# Patient Record
Sex: Male | Born: 1955
Health system: Southern US, Community
[De-identification: ages and names within clinical notes are randomized; demographics above are authoritative.]

## PROBLEM LIST (undated history)

## (undated) DIAGNOSIS — M1990 Unspecified osteoarthritis, unspecified site: Secondary | ICD-10-CM

## (undated) DIAGNOSIS — I10 Essential (primary) hypertension: Secondary | ICD-10-CM

## (undated) DIAGNOSIS — G4700 Insomnia, unspecified: Secondary | ICD-10-CM

## (undated) DIAGNOSIS — F411 Generalized anxiety disorder: Secondary | ICD-10-CM

## (undated) DIAGNOSIS — IMO0002 Reserved for concepts with insufficient information to code with codable children: Secondary | ICD-10-CM

## (undated) DIAGNOSIS — I700 Atherosclerosis of aorta: Secondary | ICD-10-CM

## (undated) DIAGNOSIS — R918 Other nonspecific abnormal finding of lung field: Secondary | ICD-10-CM

## (undated) DIAGNOSIS — F419 Anxiety disorder, unspecified: Secondary | ICD-10-CM

## (undated) DIAGNOSIS — T4145XA Adverse effect of unspecified anesthetic, initial encounter: Secondary | ICD-10-CM

## (undated) DIAGNOSIS — F17200 Nicotine dependence, unspecified, uncomplicated: Secondary | ICD-10-CM

## (undated) DIAGNOSIS — N529 Male erectile dysfunction, unspecified: Secondary | ICD-10-CM

## (undated) DIAGNOSIS — K219 Gastro-esophageal reflux disease without esophagitis: Secondary | ICD-10-CM

## (undated) DIAGNOSIS — K648 Other hemorrhoids: Secondary | ICD-10-CM

## (undated) DIAGNOSIS — E785 Hyperlipidemia, unspecified: Secondary | ICD-10-CM

## (undated) DIAGNOSIS — T8859XA Other complications of anesthesia, initial encounter: Secondary | ICD-10-CM

## (undated) DIAGNOSIS — K5732 Diverticulitis of large intestine without perforation or abscess without bleeding: Secondary | ICD-10-CM

## (undated) HISTORY — PX: POLYPECTOMY: SHX149

## (undated) HISTORY — DX: Hyperlipidemia, unspecified: E78.5

## (undated) HISTORY — PX: UPPER GASTROINTESTINAL ENDOSCOPY: SHX188

## (undated) HISTORY — DX: Gastro-esophageal reflux disease without esophagitis: K21.9

## (undated) HISTORY — DX: Unspecified osteoarthritis, unspecified site: M19.90

## (undated) HISTORY — DX: Anxiety disorder, unspecified: F41.9

## (undated) HISTORY — DX: Male erectile dysfunction, unspecified: N52.9

## (undated) HISTORY — DX: Essential (primary) hypertension: I10

## (undated) HISTORY — DX: Reserved for concepts with insufficient information to code with codable children: IMO0002

## (undated) HISTORY — DX: Nicotine dependence, unspecified, uncomplicated: F17.200

---

## 1999-11-30 ENCOUNTER — Ambulatory Visit (HOSPITAL_COMMUNITY): Admission: RE | Admit: 1999-11-30 | Discharge: 1999-11-30 | Payer: Self-pay | Admitting: Gastroenterology

## 2004-08-11 ENCOUNTER — Ambulatory Visit: Payer: Self-pay | Admitting: Family Medicine

## 2004-12-09 ENCOUNTER — Ambulatory Visit: Payer: Self-pay | Admitting: Family Medicine

## 2005-04-11 ENCOUNTER — Ambulatory Visit (HOSPITAL_COMMUNITY): Admission: RE | Admit: 2005-04-11 | Discharge: 2005-04-11 | Payer: Self-pay | Admitting: Family Medicine

## 2005-04-11 ENCOUNTER — Ambulatory Visit: Payer: Self-pay | Admitting: Family Medicine

## 2005-08-16 ENCOUNTER — Ambulatory Visit: Payer: Self-pay | Admitting: Family Medicine

## 2005-08-24 ENCOUNTER — Ambulatory Visit: Payer: Self-pay | Admitting: Internal Medicine

## 2005-12-14 ENCOUNTER — Ambulatory Visit: Payer: Self-pay | Admitting: Family Medicine

## 2006-04-10 ENCOUNTER — Ambulatory Visit: Payer: Self-pay | Admitting: Family Medicine

## 2006-07-25 ENCOUNTER — Ambulatory Visit: Payer: Self-pay | Admitting: Family Medicine

## 2006-11-09 ENCOUNTER — Ambulatory Visit: Payer: Self-pay | Admitting: Family Medicine

## 2006-11-09 LAB — CONVERTED CEMR LAB
Albumin: 4.8 g/dL (ref 3.5–5.2)
Basophils Relative: 1 % (ref 0–1)
Bilirubin, Direct: 0.1 mg/dL (ref 0.0–0.3)
Calcium: 9.6 mg/dL (ref 8.4–10.5)
Chloride: 102 meq/L (ref 96–112)
Creatinine, Ser: 0.89 mg/dL (ref 0.40–1.50)
Eosinophils Absolute: 0.3 10*3/uL (ref 0.0–0.7)
Glucose, Bld: 93 mg/dL (ref 70–99)
HCT: 47.6 % (ref 39.0–52.0)
HDL: 55 mg/dL (ref >39)
Hemoglobin: 15.9 g/dL (ref 13.0–17.0)
Indirect Bilirubin: 0.4 mg/dL (ref 0.0–0.9)
MCV: 91.4 fL (ref 78.0–100.0)
Monocytes Absolute: 0.3 10*3/uL (ref 0.2–0.7)
Neutrophils Relative %: 59 % (ref 43–77)
PSA: 0.54 ng/mL (ref 0.10–4.00)
RBC: 5.21 M/uL (ref 4.22–5.81)
RDW: 12.4 % (ref 11.5–14.0)
Sodium: 142 meq/L (ref 135–145)
Total Protein: 7.8 g/dL (ref 6.0–8.3)
WBC: 6.4 10*3/uL (ref 4.0–10.5)

## 2006-12-06 ENCOUNTER — Ambulatory Visit: Payer: Self-pay | Admitting: Internal Medicine

## 2006-12-20 ENCOUNTER — Encounter (INDEPENDENT_AMBULATORY_CARE_PROVIDER_SITE_OTHER): Payer: Self-pay | Admitting: Specialist

## 2006-12-20 ENCOUNTER — Ambulatory Visit: Payer: Self-pay | Admitting: Internal Medicine

## 2006-12-20 LAB — HM COLONOSCOPY: HM Colonoscopy: NORMAL

## 2007-03-12 ENCOUNTER — Ambulatory Visit: Payer: Self-pay | Admitting: Family Medicine

## 2007-07-12 ENCOUNTER — Ambulatory Visit: Payer: Self-pay | Admitting: Family Medicine

## 2007-07-12 LAB — CONVERTED CEMR LAB
Albumin: 4.6 g/dL (ref 3.5–5.2)
Calcium: 9.1 mg/dL (ref 8.4–10.5)
Cholesterol: 187 mg/dL (ref 0–200)
HDL: 54 mg/dL (ref >39)
Indirect Bilirubin: 0.4 mg/dL (ref 0.0–0.9)
LDL Cholesterol: 115 mg/dL — ABNORMAL HIGH (ref 0–99)
Potassium: 4.1 meq/L (ref 3.5–5.3)
Total Bilirubin: 0.5 mg/dL (ref 0.3–1.2)
Triglycerides: 91 mg/dL (ref <150)
VLDL: 18 mg/dL (ref 0–40)

## 2007-11-21 ENCOUNTER — Ambulatory Visit: Payer: Self-pay | Admitting: Family Medicine

## 2007-12-17 ENCOUNTER — Encounter: Payer: Self-pay | Admitting: Family Medicine

## 2007-12-17 DIAGNOSIS — I10 Essential (primary) hypertension: Secondary | ICD-10-CM | POA: Insufficient documentation

## 2007-12-17 DIAGNOSIS — E785 Hyperlipidemia, unspecified: Secondary | ICD-10-CM | POA: Insufficient documentation

## 2007-12-17 DIAGNOSIS — F411 Generalized anxiety disorder: Secondary | ICD-10-CM | POA: Insufficient documentation

## 2007-12-17 DIAGNOSIS — F17200 Nicotine dependence, unspecified, uncomplicated: Secondary | ICD-10-CM | POA: Insufficient documentation

## 2007-12-17 DIAGNOSIS — E739 Lactose intolerance, unspecified: Secondary | ICD-10-CM | POA: Insufficient documentation

## 2007-12-17 DIAGNOSIS — F528 Other sexual dysfunction not due to a substance or known physiological condition: Secondary | ICD-10-CM | POA: Insufficient documentation

## 2007-12-17 DIAGNOSIS — K219 Gastro-esophageal reflux disease without esophagitis: Secondary | ICD-10-CM | POA: Insufficient documentation

## 2008-02-20 ENCOUNTER — Ambulatory Visit: Payer: Self-pay | Admitting: Family Medicine

## 2008-02-20 LAB — CONVERTED CEMR LAB
AST: 19 units/L (ref 0–37)
Albumin: 4.8 g/dL (ref 3.5–5.2)
BUN: 13 mg/dL (ref 6–23)
Calcium: 9.7 mg/dL (ref 8.4–10.5)
Creatinine, Ser: 0.85 mg/dL (ref 0.40–1.50)
Eosinophils Absolute: 0.3 10*3/uL (ref 0.0–0.7)
Eosinophils Relative: 4 % (ref 0–5)
Glucose, Bld: 91 mg/dL (ref 70–99)
Hemoglobin: 16.1 g/dL (ref 13.0–17.0)
Indirect Bilirubin: 0.5 mg/dL (ref 0.0–0.9)
Monocytes Absolute: 0.6 10*3/uL (ref 0.1–1.0)
Monocytes Relative: 8 % (ref 3–12)
Neutro Abs: 4.5 10*3/uL (ref 1.7–7.7)
PSA: 0.54 ng/mL (ref 0.10–4.00)
Platelets: 225 10*3/uL (ref 150–400)
Potassium: 4.7 meq/L (ref 3.5–5.3)
Sodium: 139 meq/L (ref 135–145)

## 2008-07-30 ENCOUNTER — Encounter: Payer: Self-pay | Admitting: Family Medicine

## 2008-08-06 ENCOUNTER — Ambulatory Visit: Payer: Self-pay | Admitting: Family Medicine

## 2008-08-06 LAB — CONVERTED CEMR LAB: Glucose, Bld: 108 mg/dL

## 2008-12-16 ENCOUNTER — Ambulatory Visit: Payer: Self-pay | Admitting: Family Medicine

## 2009-02-24 ENCOUNTER — Encounter: Payer: Self-pay | Admitting: Family Medicine

## 2009-03-13 ENCOUNTER — Encounter: Payer: Self-pay | Admitting: Family Medicine

## 2009-03-16 LAB — CONVERTED CEMR LAB
ALT: 10 units/L (ref 0–53)
Basophils Absolute: 0.1 10*3/uL (ref 0.0–0.1)
CO2: 24 meq/L (ref 19–32)
Calcium: 9.2 mg/dL (ref 8.4–10.5)
Eosinophils Absolute: 0.3 10*3/uL (ref 0.0–0.7)
HDL: 61 mg/dL (ref >39)
Hemoglobin: 15.9 g/dL (ref 13.0–17.0)
LDL Cholesterol: 143 mg/dL — ABNORMAL HIGH (ref 0–99)
Lymphs Abs: 2 10*3/uL (ref 0.7–4.0)
Monocytes Relative: 6 % (ref 3–12)
Neutrophils Relative %: 64 % (ref 43–77)
Platelets: 218 10*3/uL (ref 150–400)
RBC: 5.05 M/uL (ref 4.22–5.81)
RDW: 12.8 % (ref 11.5–15.5)

## 2009-03-27 ENCOUNTER — Encounter: Payer: Self-pay | Admitting: Family Medicine

## 2009-07-30 ENCOUNTER — Encounter: Payer: Self-pay | Admitting: Family Medicine

## 2009-08-17 ENCOUNTER — Ambulatory Visit: Payer: Self-pay | Admitting: Family Medicine

## 2010-01-06 ENCOUNTER — Ambulatory Visit: Payer: Self-pay | Admitting: Family Medicine

## 2010-01-06 LAB — CONVERTED CEMR LAB: OCCULT 1: NEGATIVE

## 2010-01-12 LAB — CONVERTED CEMR LAB
AST: 17 units/L (ref 0–37)
Alkaline Phosphatase: 86 units/L (ref 39–117)
BUN: 12 mg/dL (ref 6–23)
Bilirubin, Direct: 0.1 mg/dL (ref 0.0–0.3)
CO2: 24 meq/L (ref 19–32)
Cholesterol: 252 mg/dL — ABNORMAL HIGH (ref 0–200)
Creatinine, Ser: 0.84 mg/dL (ref 0.40–1.50)
Glucose, Bld: 90 mg/dL (ref 70–99)
HCT: 47.7 % (ref 39.0–52.0)
HDL: 54 mg/dL (ref >39)
Hemoglobin: 16.1 g/dL (ref 13.0–17.0)
LDL Cholesterol: 171 mg/dL — ABNORMAL HIGH (ref 0–99)
MCHC: 33.8 g/dL (ref 30.0–36.0)
Neutro Abs: 4.9 10*3/uL (ref 1.7–7.7)
Neutrophils Relative %: 61 % (ref 43–77)
Potassium: 4.2 meq/L (ref 3.5–5.3)
RBC: 5.14 M/uL (ref 4.22–5.81)
RDW: 12.9 % (ref 11.5–15.5)
Sodium: 135 meq/L (ref 135–145)
Total CHOL/HDL Ratio: 4.7
VLDL: 27 mg/dL (ref 0–40)
WBC: 8.2 10*3/uL (ref 4.0–10.5)

## 2010-05-17 ENCOUNTER — Telehealth: Payer: Self-pay | Admitting: Family Medicine

## 2010-11-02 NOTE — Progress Notes (Signed)
Summary: medicine  Phone Note Call from Patient   Summary of Call: [facility] [place] call back at [phone] and let him know Initial call taken by: Lind Guest,  May 17, 2010 4:55 PM  Follow-up for Phone Call        previous script was for cialis wants viagra patient not due back until end of sept Follow-up by: Adella Hare LPN,  May 18, 2010 8:53 AM  Additional Follow-up for Phone Call Additional follow up Details #1::        OK to prescription Viagra 100 mg #9 to take one daily as needed  x 3 rf.  Advise pt he must stop the Cialis. Additional Follow-up by: Esperanza Sheets PA,  May 18, 2010 9:12 AM    Additional Follow-up for Phone Call Additional follow up Details #2::    rx sent and patient aware Follow-up by: Adella Hare LPN,  May 18, 2010 9:21 AM  New/Updated Medications: VIAGRA 100 MG TABS (SILDENAFIL CITRATE) one tab by mouth once daily as needed Prescriptions: VIAGRA 100 MG TABS (SILDENAFIL CITRATE) one tab by mouth once daily as needed  #9 x 3   Entered by:   Adella Hare LPN   Authorized by:   Syliva Overman MD   Signed by:   Adella Hare LPN on 10/05/7251   Method used:   [facility]  [place] [address]       [address]       [place], [place]  [address]       Ph: [phone] or [phone]       Fax: [phone]   RxID:   [id number]

## 2010-11-02 NOTE — Assessment & Plan Note (Signed)
Summary: PHY   Vital Signs:  Patient profile:   55 year old male Height:      70 inches Weight:      178.25 pounds BMI:     25.67 O2 Sat:      98 % Pulse rate:   78 / minute Pulse rhythm:   regular Resp:     16 per minute BP sitting:   118 / 80  (left arm) Cuff size:   regular  Vitals Entered By: Everitt Amber LPN (January 06, 1609 8:59 AM)  Nutrition Counseling: Patient's BMI is greater than 25 and therefore counseled on weight management options. CC: cpe  Vision Screening:Left eye w/o correction: 20 / 20 Right Eye w/o correction: 20 / 20 Both eyes w/o correction:  20/ 20  Color vision testing: normal      Vision Entered By: Everitt Amber LPN (January 07, 9603 8:58 AM)   CC:  cpe.  History of Present Illness: Reports  thathe has been doing well. Denies recent fever or chills. Denies sinus pressure, nasal congestion , ear pain or sore throat. Denies chest congestion, or cough productive of sputum. Denies chest pain, palpitations, PND, orthopnea or leg swelling. Denies abdominal pain, nausea, vomitting, diarrhea or constipation. Denies change in bowel movements or bloody stool. Denies dysuria , frequency, incontinence or hesitancy. Denies  joint pain, swelling, or reduced mobility. Denies headaches, vertigo, seizures. Denies depression, anxiety or insomnia. Denies  rash, lesions, or itch. The pt has discontinued his cholesterol meds, stating that he is trying diet and exercisie in its place. His smoking habits are unchanged, but he is willing to attempt to cut back and quit.     Preventive Screening-Counseling & Management  Alcohol-Tobacco     Smoking Cessation Counseling: yes  Current Medications (verified): 1)  Xanax 1 Mg  Tabs (Alprazolam) .... One Tab By Mouth Once Daily Prn 2)  Lovastatin 40 Mg  Tabs (Lovastatin) .... Two Tabs By Mouth At Bedtime 3)  Prevacid 30 Mg Cpdr (Lansoprazole) .... Take 1 Capsule By Mouth Once A Day 4)  Viagra 100 Mg Tabs (Sildenafil  Citrate) .... Take 1 Tablet By Mouth Once A Day As Needed 5)  Zestoretic 20-12.5 Mg Tabs (Lisinopril-Hydrochlorothiazide) .... 2 Tabs Dailly  Allergies (verified): No Known Drug Allergies  Review of Systems      See HPI General:  Complains of fatigue; denies chills and fever. Eyes:  Denies blurring, discharge, and red eye. GU:  Complains of erectile dysfunction. 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 Endo:  Denies excessive hunger, excessive thirst, excessive urination, and heat intolerance. Heme:  Denies abnormal bruising and bleeding. Allergy:  Complains of seasonal allergies; denies hives or rash and itching eyes; mild.  Physical Exam  General:  Well-developed,well-nourished,in no acute distress; alert,appropriate and cooperative throughout examination Head:  Normocephalic and atraumatic without obvious abnormalities. No apparent alopecia or balding. Eyes:  No corneal or conjunctival inflammation noted. EOMI. Perrla. Funduscopic exam benign, without hemorrhages, exudates or papilledema. Vision grossly normal. Ears:  External ear exam shows no significant lesions or deformities.  Otoscopic examination reveals clear canals, tympanic membranes are intact bilaterally without bulging, retraction, inflammation or discharge. Hearing is grossly normal bilaterally. Nose:  External nasal examination shows no deformity or inflammation. Nasal mucosa are pink and moist without lesions or exudates. Mouth:  pharynx pink and moist, fair dentition, and teeth missing.   Neck:  No deformities, masses, or tenderness noted. Chest Wall:  No deformities, masses, tenderness or gynecomastia noted. Breasts:  No masses or gynecomastia noted Lungs:  decreased air entry, no crackles or wheezes Heart:  Normal rate and regular rhythm. S1 and S2 normal without gallop, murmur, click, rub or other extra sounds. Abdomen:  Bowel sounds positive,abdomen soft and non-tender  without masses, organomegaly or hernias noted. Rectal:  No external abnormalities noted. Normal sphincter tone. No rectal masses or tenderness.Guaic negative stool Genitalia:  Testes bilaterally descended without nodularity, tenderness or masses. No scrotal masses or lesions. No penis lesions or urethral discharge. Prostate:  Prostate gland firm and smooth, no enlargement, nodularity, tenderness, mass, asymmetry or induration. Msk:  No deformity or scoliosis noted of thoracic or lumbar spine.   Pulses:  R and L carotid,radial,femoral,dorsalis pedis and posterior tibial pulses are full and equal bilaterally Extremities:  No clubbing, cyanosis, edema, or deformity noted with normal full range of motion of all joints.   Neurologic:  No cranial nerve deficits noted. Station and gait are normal. Plantar reflexes are down-going bilaterally. DTRs are symmetrical throughout. Sensory, motor and coordinative functions appear intact. Skin:  Intact without suspicious lesions or rashes Cervical Nodes:  No lymphadenopathy noted Axillary Nodes:  No palpable lymphadenopathy Inguinal Nodes:  No significant adenopathy Psych:  Cognition and judgment appear intact. Alert and cooperative with normal attention span and concentration. No apparent delusions, illusions, hallucinations   Impression & Recommendations:  Problem # 1:  NICOTINE ADDICTION (ICD-305.1) Assessment Unchanged  Encouraged smoking cessation and discussed different methods for smoking cessation.   Problem # 2:  GERD (ICD-530.81) Assessment: Improved  The following medications were removed from the medication list:    Prevacid 30 Mg Cpdr (Lansoprazole) .Marland Kitchen... Take 1 capsule by mouth once a day, pt currently asymptomatic off meds  Problem # 3:  ANXIETY (ICD-300.00) Assessment: Unchanged  The following medications were removed from the medication list:    Xanax 1 Mg Tabs (Alprazolam) ..... One tab by mouth once daily prn His updated  medication list for this problem includes:    Alprazolam 1 Mg Tabs (Alprazolam) .Marland Kitchen... Take 1 tab by mouth at bedtime  Problem # 4:  ERECTILE DYSFUNCTION (ICD-302.72) Assessment: Unchanged  The following medications were removed from the medication list:    Viagra 100 Mg Tabs (Sildenafil citrate) .Marland Kitchen... Take 1 tablet by mouth once a day as needed His updated medication list for this problem includes:    Cialis 5 Mg Tabs (Tadalafil) .Marland Kitchen... Take 1 tablet by mouth once a day  Problem # 5:  HYPERTENSION (ICD-401.9) Assessment: Unchanged  The following medications were removed from the medication list:    Zestoretic 20-12.5 Mg Tabs (Lisinopril-hydrochlorothiazide) .Marland Kitchen... 2 tabs dailly His updated medication list for this problem includes:    Zestoretic 20-12.5 Mg Tabs (Lisinopril-hydrochlorothiazide) .Marland Kitchen..Marland Kitchen Two tablets daily  BP today: 118/80 Prior BP: 123/83 (08/17/2009)  Labs Reviewed: K+: 4.3 (03/13/2009) Creat: : 0.84 (03/13/2009)   Chol: 216 (03/13/2009)   HDL: 61 (03/13/2009)   LDL: 143 (03/13/2009)   TG: 62 (03/13/2009)  Problem # 6:  HYPERLIPIDEMIA (ICD-272.4) Assessment: Deteriorated  The following medications were removed from the medication list:    Lovastatin 40 Mg Tabs (Lovastatin) .Marland Kitchen..Marland Kitchen Two tabs by mouth at bedtime His updated medication list for this problem includes:    Lovastatin 40 Mg Tabs (Lovastatin) .Marland Kitchen..Marland Kitchen Two tablets at bedtime pt had discontinued meds, but based on labs done on the day of the visit , he will need to resume, also to follow a low fat diet.  Labs Reviewed: SGOT: 12 (03/13/2009)   SGPT: 10 (03/13/2009)   HDL:61 (03/13/2009), 60 (02/20/2008)  LDL:143 (03/13/2009), 88 (69/62/9528)  Chol:216 (03/13/2009), 165 (02/20/2008)  Trig:62 (03/13/2009), 87 (02/20/2008)  Complete Medication List: 1)  Cialis 5 Mg Tabs (Tadalafil) .... Take 1 tablet by mouth once a day 2)  Zestoretic 20-12.5 Mg Tabs (Lisinopril-hydrochlorothiazide) .... Two tablets daily 3)   Alprazolam 1 Mg Tabs (Alprazolam) .... Take 1 tab by mouth at bedtime 4)  Lovastatin 40 Mg Tabs (Lovastatin) .... Two tablets at bedtime  Other Orders: Hemoccult Guaiac-1 spec.(in office) ([id number])  Patient Instructions: 1)  Please schedule a follow-up appointment in 5.5 months 2)  Tobacco is very bad for your health and your loved ones! You Should stop smoking!. 3)  Stop Smoking Tips: Choose a Quit date. Cut down before the Quit date. decide what you will do as a substitute when you feel the urge to smoke(gum,toothpick,exercise). 4)  It is important that you exercise regularly at least 20 minutes 5 times a week. If you develop chest pain, have severe difficulty breathing, or feel very tired , stop exercising immediately and seek medical attention. 5)  You need to lose weight. Consider a lower calorie diet and regular exercise.  6)  It is not healthy  for men to drink more than 2-3 drinks per day or for women to drink more than 1-2 drinks per day. Prescriptions: LOVASTATIN 40 MG TABS (LOVASTATIN) two tablets at bedtime  #180 x 1   Entered and Authorized by:   Syliva Overman MD   Signed by:   Syliva Overman MD on 01/10/2010   Method used:   [facility]  [place] [address]       [address]       [place], [place]  [address]       Ph: [phone] or [phone]       Fax: [phone]   RxID:   [id number] ALPRAZOLAM 1 MG TABS (ALPRAZOLAM) Take 1 tab by mouth at bedtime  #90 x 1   Entered and Authorized by:   Syliva Overman MD   Signed by:   Syliva Overman MD on 01/06/2010   Method used:   Printed then faxed to ...       [facility]  [place] [address] ([facility]ail)       [address]       [place], [place]  [address]       Ph: [phone] or [phone]       Fax: [phone]   RxID:   [id number] ZESTORETIC 20-12.5 MG TABS (LISINOPRIL-HYDROCHLOROTHIAZIDE) two tablets daily  #180 x 2   Entered and Authorized by:   Syliva Overman MD   Signed by:   Syliva Overman MD on 01/06/2010   Method used:   [facility]        [facility]  [place] [address] [facility]       [address]       [place], [place]  [address]       Ph: [phone] or [phone]       Fax: [phone]   RxID:   [id number] CIALIS 5 MG TABS (TADALAFIL) Take 1 tablet by mouth once a day  #30 x 5   Entered and Authorized by:   Syliva Overman MD   Signed by:   Syliva Overman MD on 01/06/2010   Method used:   [facility]  [facility]  [place] [address] ([facility]ail[facility]       [address]       [place], [place]  [address]       Ph: [phone] or [phone]       Fax: [phone]   RxID:   [id number]    Laboratory Results    Stool - Occult Blood Hemmoccult #1: negative Date: 01/06/2010 Comments: [id number] 9r 8/11 118 10/12

## 2011-01-20 ENCOUNTER — Encounter: Payer: Self-pay | Admitting: Family Medicine

## 2011-01-21 ENCOUNTER — Encounter: Payer: Self-pay | Admitting: Family Medicine

## 2011-01-24 ENCOUNTER — Encounter: Payer: Self-pay | Admitting: Family Medicine

## 2011-01-24 ENCOUNTER — Ambulatory Visit (INDEPENDENT_AMBULATORY_CARE_PROVIDER_SITE_OTHER): Payer: PRIVATE HEALTH INSURANCE | Admitting: Family Medicine

## 2011-01-24 VITALS — BP 130/84 | HR 74 | Resp 16 | Ht 70.0 in | Wt 180.8 lb

## 2011-01-24 DIAGNOSIS — M549 Dorsalgia, unspecified: Secondary | ICD-10-CM

## 2011-01-24 DIAGNOSIS — F411 Generalized anxiety disorder: Secondary | ICD-10-CM

## 2011-01-24 DIAGNOSIS — R5383 Other fatigue: Secondary | ICD-10-CM

## 2011-01-24 DIAGNOSIS — F17200 Nicotine dependence, unspecified, uncomplicated: Secondary | ICD-10-CM

## 2011-01-24 DIAGNOSIS — Z125 Encounter for screening for malignant neoplasm of prostate: Secondary | ICD-10-CM

## 2011-01-24 DIAGNOSIS — N529 Male erectile dysfunction, unspecified: Secondary | ICD-10-CM

## 2011-01-24 DIAGNOSIS — I10 Essential (primary) hypertension: Secondary | ICD-10-CM

## 2011-01-24 DIAGNOSIS — Z0000 Encounter for general adult medical examination without abnormal findings: Secondary | ICD-10-CM

## 2011-01-24 DIAGNOSIS — R5381 Other malaise: Secondary | ICD-10-CM

## 2011-01-24 DIAGNOSIS — E785 Hyperlipidemia, unspecified: Secondary | ICD-10-CM

## 2011-01-24 LAB — CBC WITH DIFFERENTIAL/PLATELET
Basophils Absolute: 0.1 10*3/uL (ref 0.0–0.1)
Lymphocytes Relative: 24 % (ref 12–46)
MCHC: 35.4 g/dL (ref 30.0–36.0)
Monocytes Absolute: 0.5 10*3/uL (ref 0.1–1.0)
Monocytes Relative: 8 % (ref 3–12)
Neutrophils Relative %: 65 % (ref 43–77)
Platelets: 209 10*3/uL (ref 150–400)

## 2011-01-24 LAB — LIPID PANEL
Total CHOL/HDL Ratio: 4 Ratio
Triglycerides: 57 mg/dL (ref <150)

## 2011-01-24 LAB — BASIC METABOLIC PANEL
CO2: 27 mEq/L (ref 19–32)
Calcium: 9.8 mg/dL (ref 8.4–10.5)
Creat: 0.82 mg/dL (ref 0.40–1.50)
Potassium: 4.5 mEq/L (ref 3.5–5.3)
Sodium: 137 mEq/L (ref 135–145)

## 2011-01-24 LAB — HEPATIC FUNCTION PANEL
ALT: 17 U/L (ref 0–53)
AST: 23 U/L (ref 0–37)

## 2011-01-24 MED ORDER — TADALAFIL 10 MG PO TABS: 10.0000 mg | ORAL_TABLET | ORAL | Status: DC | PRN

## 2011-01-24 MED ORDER — HYDROCODONE-ACETAMINOPHEN 5-500 MG PO TABS: 1.0000 | ORAL_TABLET | ORAL | Status: DC | PRN

## 2011-01-24 NOTE — Patient Instructions (Signed)
F/i\u in 6 months.  Fasting labs today.   pls make appt for eye exam   Please think about quitting smoking.  This is very important for your health.  Consider setting a quit date, then cutting back or switching brands to prepare to stop.  Also think of the money you will save every day by not smoking.  Quick Tips to Quit Smoking: Fix a date i.e. keep a date in mind from when you would not touch a tobacco product to smoke  Keep yourself busy and block your mind with work loads or reading books or watching movies in malls where smoking is not allowed  Vanish off the things which reminds you about smoking for example match box, or your favorite lighter, or the pipe you used for smoking, or your favorite jeans and shirt with which you used to enjoy smoking, or the club where you used to do smoking  Try to avoid certain people places and incidences where and with whom smoking is a common factor to add on  Praise yourself with some token gifts from the money you saved by stopping smoking  Anti Smoking teams are there to help you. Join their programs  Anti-smoking Gums are there in many medical shops. Try them to quit smoking   Side-effects of Smoking: Disease caused by smoking cigarettes are emphysema, bronchitis, heart failures  Premature death  Cancer is the major side effect of smoking  Heart attacks and strokes are the quick effects of smoking causing sudden death  Some smokers lives end up with limbs amputated  Breathing problem or fast breathing is another side effect of smoking  Due to more intakes of smokes, carbon mono-oxide goes into your brain and other muscles of the body which leads to swelling of the veins and blockage to the air passage to lungs  Carbon monoxide blocks blood vessels which leads to blockage in the flow of blood to different major body organs like heart lungs and thus leads to attacks and deaths  During pregnancy smoking is very harmful and leads to premature birth  of the infant, spontaneous abortions, low weight of the infant during birth  Fat depositions to narrow and blocked blood vessels causing heart attacks  In many cases cigarette smoking caused infertility in men

## 2011-01-25 MED ORDER — PRAVASTATIN SODIUM 80 MG PO TABS: 80.0000 mg | ORAL_TABLET | Freq: Every evening | ORAL | Status: DC

## 2011-01-27 ENCOUNTER — Other Ambulatory Visit: Payer: Self-pay

## 2011-01-27 MED ORDER — ALPRAZOLAM 1 MG PO TABS: 1.0000 mg | ORAL_TABLET | Freq: Every day | ORAL | Status: DC

## 2011-02-03 ENCOUNTER — Telehealth: Payer: Self-pay | Admitting: Family Medicine

## 2011-02-03 ENCOUNTER — Other Ambulatory Visit: Payer: Self-pay

## 2011-02-03 DIAGNOSIS — E785 Hyperlipidemia, unspecified: Secondary | ICD-10-CM

## 2011-02-03 MED ORDER — PRAVASTATIN SODIUM 80 MG PO TABS: 80.0000 mg | ORAL_TABLET | Freq: Every evening | ORAL | Status: DC

## 2011-02-03 NOTE — Telephone Encounter (Signed)
Sent to [facility] on [address] 

## 2011-02-08 NOTE — Assessment & Plan Note (Signed)
Unchanged, counselled to quit, not comiting at this time

## 2011-02-08 NOTE — Assessment & Plan Note (Signed)
Will review labs after drawn today, of note pt has not been taking med as prescribed by choice but willing to resume if needed

## 2011-02-08 NOTE — Progress Notes (Signed)
  Subjective:    Patient ID: Eugene Acevedo, male    DOB: 06/14/1956, 55 y.o.   MRN: [id number]  HPI The PT is here for annual exam  and re-evaluation of chronic medical conditions, medication management and review of recent lab and radiology data.  Preventive health is updated, specifically  Cancer screening, and Immunization.    The PT denies any adverse reactions to current medications since the last visit.  There are no new concerns.  There are no specific complaints . He is unwilling to setting a quit date at this time, and continues to smoke rgularly. He reports less alcohol use.      Review of Systems Denies recent fever or chills. Denies sinus pressure, nasal congestion, ear pain or sore throat. Denies chest congestion, productive cough or wheezing. Denies chest pains, palpitations, paroxysmal nocturnal dyspnea, orthopnea and leg swelling Denies abdominal pain, nausea, vomiting,diarrhea or constipation.  Denies rectal bleeding or change in bowel movement. Denies dysuria, frequency, hesitancy or incontinence. Denies joint pain, swelling and limitation in mobility. Denies headaches, seizure, numbness, or tingling. Denies depression,or uncontrolled  anxiety or insomnia.He takes medication regularly for this Denies skin break down or rash.       Objective:   Physical Exam Pleasant well nourished male, alert and oriented x 3, in no cardio-pulmonary distress. Afebrile. HEENT No facial trauma or asymetry.   EOMI, PERTL, fundoscopic exam is negative for hemorhages or exudates. External ears normal, tympanic membranes clear. Oropharynx moist, no exudate, good dentition. Neck: supple, no adenopathy,JVD or thyromegaly.No bruits.  Chest: Clear to ascultation bilaterally.No crackles or wheezes. Non tender to palpation  Breast: No asymetry,no masses. No nipple discharge or inversion. No axillary or supraclavicular adenopathy  Cardiovascular system; Heart sounds normal,   S1 and  S2 ,no S3.  No murmur, or thrill. Apical beat not displaced Peripheral pulses normal.  Abdomen: Soft, non tender, no organomegaly or masses. No bruits. Bowel sounds normal. No guarding, tenderness or rebound.  Rectal:  No mass. guaic negative stool. Prostate smooth and firm  GU: No penile lesion or discharge. No testicular mass.  Musculoskeletal exam: Full ROM of spine, hips , shoulders and knees. No deformity ,swelling or crepitus noted. No muscle wasting or atrophy.   Neurologic: Cranial nerves 2 to 12 intact. Power, tone ,sensation and reflexes normal throughout. No disturbance in gait. No tremor.  Skin: Intact, no ulceration, erythema , scaling or rash noted. Pigmentation normal throughout  Psych; Normal mood and affect. Judgement and concentration normal      Assessment & Plan:

## 2011-02-08 NOTE — Assessment & Plan Note (Signed)
Controlled, no change in medication  

## 2011-02-09 ENCOUNTER — Other Ambulatory Visit: Payer: Self-pay | Admitting: Family Medicine

## 2011-02-24 ENCOUNTER — Telehealth: Payer: Self-pay | Admitting: Family Medicine

## 2011-02-24 NOTE — Telephone Encounter (Signed)
Need printed documentation faxed into the office before refill can be ok'd, let him know

## 2011-02-25 NOTE — Telephone Encounter (Signed)
Called patient, left message.

## 2011-03-01 NOTE — Telephone Encounter (Signed)
Patient aware.

## 2011-03-07 ENCOUNTER — Telehealth: Payer: Self-pay | Admitting: Family Medicine

## 2011-03-08 NOTE — Telephone Encounter (Signed)
Let pt know that it is his responsibility to have the police fax the report over to the office, the office is not responsible ofr obtaining police reports

## 2011-05-30 ENCOUNTER — Telehealth: Payer: Self-pay | Admitting: Family Medicine

## 2011-06-09 ENCOUNTER — Other Ambulatory Visit: Payer: Self-pay | Admitting: Family Medicine

## 2011-07-21 ENCOUNTER — Encounter: Payer: Self-pay | Admitting: Family Medicine

## 2011-07-25 ENCOUNTER — Ambulatory Visit (INDEPENDENT_AMBULATORY_CARE_PROVIDER_SITE_OTHER): Payer: PRIVATE HEALTH INSURANCE | Admitting: Family Medicine

## 2011-07-25 ENCOUNTER — Encounter: Payer: Self-pay | Admitting: Family Medicine

## 2011-07-25 ENCOUNTER — Ambulatory Visit (HOSPITAL_COMMUNITY)
Admission: RE | Admit: 2011-07-25 | Discharge: 2011-07-25 | Disposition: A | Discharge status: Home / Self Care | Payer: PRIVATE HEALTH INSURANCE | Source: Ambulatory Visit | Attending: Family Medicine | Admitting: Family Medicine

## 2011-07-25 ENCOUNTER — Other Ambulatory Visit: Payer: Self-pay | Admitting: Family Medicine

## 2011-07-25 VITALS — BP 122/80 | HR 73 | Resp 16 | Ht 70.0 in | Wt 183.0 lb

## 2011-07-25 DIAGNOSIS — F411 Generalized anxiety disorder: Secondary | ICD-10-CM

## 2011-07-25 DIAGNOSIS — K219 Gastro-esophageal reflux disease without esophagitis: Secondary | ICD-10-CM

## 2011-07-25 DIAGNOSIS — F17200 Nicotine dependence, unspecified, uncomplicated: Secondary | ICD-10-CM

## 2011-07-25 DIAGNOSIS — I10 Essential (primary) hypertension: Secondary | ICD-10-CM | POA: Insufficient documentation

## 2011-07-25 DIAGNOSIS — M25519 Pain in unspecified shoulder: Secondary | ICD-10-CM

## 2011-07-25 DIAGNOSIS — Z125 Encounter for screening for malignant neoplasm of prostate: Secondary | ICD-10-CM

## 2011-07-25 DIAGNOSIS — R5381 Other malaise: Secondary | ICD-10-CM

## 2011-07-25 DIAGNOSIS — R5383 Other fatigue: Secondary | ICD-10-CM

## 2011-07-25 DIAGNOSIS — E785 Hyperlipidemia, unspecified: Secondary | ICD-10-CM

## 2011-07-25 DIAGNOSIS — E739 Lactose intolerance, unspecified: Secondary | ICD-10-CM

## 2011-07-25 DIAGNOSIS — M25512 Pain in left shoulder: Secondary | ICD-10-CM

## 2011-07-25 DIAGNOSIS — M1990 Unspecified osteoarthritis, unspecified site: Secondary | ICD-10-CM | POA: Insufficient documentation

## 2011-07-25 DIAGNOSIS — M549 Dorsalgia, unspecified: Secondary | ICD-10-CM

## 2011-07-25 MED ORDER — PREDNISONE (PAK) 5 MG PO TABS: 5.0000 mg | ORAL_TABLET | ORAL | Status: DC

## 2011-07-25 MED ORDER — HYDROCODONE-ACETAMINOPHEN 5-500 MG PO TABS: 1.0000 | ORAL_TABLET | ORAL | Status: DC | PRN

## 2011-07-25 MED ORDER — SILDENAFIL CITRATE 100 MG PO TABS: 100.0000 mg | ORAL_TABLET | ORAL | Status: DC | PRN

## 2011-07-25 MED ORDER — LISINOPRIL-HYDROCHLOROTHIAZIDE 20-12.5 MG PO TABS: 2.0000 | ORAL_TABLET | Freq: Every day | ORAL | Status: DC

## 2011-07-25 MED ORDER — ALPRAZOLAM 1 MG PO TABS: 1.0000 mg | ORAL_TABLET | Freq: Every day | ORAL | Status: DC

## 2011-07-25 NOTE — Patient Instructions (Addendum)
CPE end April, after the 25th   Please think about quitting smoking.  This is very important for your health.  Consider setting a quit date, then cutting back or switching brands to prepare to stop.  Also think of the money you will save every day by not smoking.  Quick Tips to Quit Smoking: Fix a date i.e. keep a date in mind from when you would not touch a tobacco product to smoke  Keep yourself busy and block your mind with work loads or reading books or watching movies in malls where smoking is not allowed  Vanish off the things which reminds you about smoking for example match box, or your favorite lighter, or the pipe you used for smoking, or your favorite jeans and shirt with which you used to enjoy smoking, or the club where you used to do smoking  Try to avoid certain people places and incidences where and with whom smoking is a common factor to add on  Praise yourself with some token gifts from the money you saved by stopping smoking  Anti Smoking teams are there to help you. Join their programs  Anti-smoking Gums are there in many medical shops. Try them to quit smoking   Side-effects of Smoking: Disease caused by smoking cigarettes are emphysema, bronchitis, heart failures  Premature death  Cancer is the major side effect of smoking  Heart attacks and strokes are the quick effects of smoking causing sudden death  Some smokers lives end up with limbs amputated  Breathing problem or fast breathing is another side effect of smoking  Due to more intakes of smokes, carbon mono-oxide goes into your brain and other muscles of the body which leads to swelling of the veins and blockage to the air passage to lungs  Carbon monoxide blocks blood vessels which leads to blockage in the flow of blood to different major body organs like heart lungs and thus leads to attacks and deaths  During pregnancy smoking is very harmful and leads to premature birth of the infant, spontaneous abortions, low  weight of the infant during birth  Fat depositions to narrow and blocked blood vessels causing heart attacks  In many cases cigarette smoking caused infertility in men   Fasting labs in April before next visit., after 23rd  Prednisone dose pack sent in for your left shoulder pain  I will f/u on last colonscopy and you will be referred if due  CXR today

## 2011-07-26 ENCOUNTER — Telehealth: Payer: Self-pay | Admitting: *Deleted

## 2011-07-26 LAB — HEPATIC FUNCTION PANEL
ALT: 24 U/L (ref 0–53)
AST: 21 U/L (ref 0–37)
Albumin: 4.7 g/dL (ref 3.5–5.2)
Bilirubin, Direct: 0.1 mg/dL (ref 0.0–0.3)
Total Protein: 7.3 g/dL (ref 6.0–8.3)

## 2011-07-26 LAB — LIPID PANEL
Cholesterol: 201 mg/dL — ABNORMAL HIGH (ref 0–200)
HDL: 63 mg/dL (ref >39)
LDL Cholesterol: 122 mg/dL — ABNORMAL HIGH (ref 0–99)
Triglycerides: 81 mg/dL (ref <150)

## 2011-07-26 NOTE — Telephone Encounter (Signed)
Called patient, no answer 

## 2011-07-26 NOTE — Telephone Encounter (Signed)
Message copied by Diamantina Monks on Tue Jul 26, 2011  2:39 PM ------      Message from: Syliva Overman MD E      Created: Tue Jul 26, 2011  5:59 AM       Advise great improvement in cholesterol, almost within normal, can continue what he is doing, changing diet , cutting back on fatty food and rd meat should  Help also. He has not been taking pravastatin, ut fish oil, his numbers are so much better, i believe he can get to goal without the pravstatin, so he can hold on filling it

## 2011-07-26 NOTE — Telephone Encounter (Signed)
Message copied by Diamantina Monks on Tue Jul 26, 2011  2:39 PM ------      Message from: Syliva Overman MD E      Created: Mon Jul 25, 2011  2:47 PM       plsadvise heart and lungs look normal, normal cxr

## 2011-08-01 NOTE — Progress Notes (Signed)
  Subjective:    Patient ID: Eugene Acevedo, male    DOB: 01/22/1956, 55 y.o.   MRN: [id number]  HPI The PT is here for follow up and re-evaluation of chronic medical conditions, medication management and review of any available recent lab and radiology data.  Preventive health is updated, specifically  Cancer screening and Immunization.   Questions or concerns regarding consultations or procedures which the PT has had in the interim are  addressed. The PT denies any adverse reactions to current medications since the last visit.  C/o increased right shoulder pain  with reduced ROM in the past 1 month    Review of Systems See HPI Denies recent fever or chills. Denies sinus pressure, nasal congestion, ear pain or sore throat. Denies chest congestion, productive cough or wheezing. Denies chest pains, palpitations and leg swelling Denies abdominal pain, nausea, vomiting,diarrhea or constipation.   Denies dysuria, frequency, hesitancy or incontinence.  Denies headaches, seizures, numbness, or tingling. Denies depression,uncontrolled  anxiety or insomnia. Denies skin break down or rash.        Objective:   Physical Exam Patient alert and oriented and in no cardiopulmonary distress.  HEENT: No facial asymmetry, EOMI, no sinus tenderness,  oropharynx pink and moist.  Neck supple no adenopathy.  Chest: Clear to auscultation bilaterally.Decreased air entry throughout  CVS: S1, S2 no murmurs, no S3.  ABD: Soft non tender. Bowel sounds normal.  Ext: No edema  MS: Adequate ROM spine,  hips and knees.decreased ROM right shoulder  Skin: Intact, no ulcerations or rash noted.  Psych: Good eye contact, normal affect. Memory intact not anxious or depressed appearing.  CNS: CN 2-12 intact, power, tone and sensation normal throughout.        Assessment & Plan:

## 2011-08-01 NOTE — Assessment & Plan Note (Signed)
Controlled, no change in medication  

## 2011-08-01 NOTE — Assessment & Plan Note (Signed)
Increased and uncontrolled with limitation in mobility, prednisone prescribed, pt to call for ortho referral if no significant improvement

## 2011-08-01 NOTE — Assessment & Plan Note (Signed)
Unchanged , cessation counseling done 

## 2011-08-01 NOTE — Assessment & Plan Note (Signed)
Marked improvement by dietary change only which is excellent

## 2011-08-19 NOTE — Telephone Encounter (Signed)
Message was sent to [facility]

## 2011-10-26 ENCOUNTER — Encounter: Payer: Self-pay | Admitting: Internal Medicine

## 2011-11-22 ENCOUNTER — Encounter: Payer: Self-pay | Admitting: Internal Medicine

## 2011-12-13 ENCOUNTER — Other Ambulatory Visit: Payer: Self-pay | Admitting: Family Medicine

## 2011-12-14 ENCOUNTER — Telehealth: Payer: Self-pay | Admitting: Family Medicine

## 2011-12-14 MED ORDER — SILDENAFIL CITRATE 100 MG PO TABS: 100.0000 mg | ORAL_TABLET | ORAL | Status: DC | PRN

## 2011-12-14 NOTE — Telephone Encounter (Signed)
Sent in

## 2011-12-15 NOTE — Telephone Encounter (Signed)
Ok to refill x 2  

## 2011-12-15 NOTE — Telephone Encounter (Signed)
Do you want to refill viagra?

## 2011-12-16 NOTE — Telephone Encounter (Signed)
Refilled x 2 

## 2012-01-10 ENCOUNTER — Ambulatory Visit (AMBULATORY_SURGERY_CENTER): Payer: PRIVATE HEALTH INSURANCE | Admitting: *Deleted

## 2012-01-10 ENCOUNTER — Encounter: Payer: Self-pay | Admitting: Internal Medicine

## 2012-01-10 VITALS — Ht 70.0 in | Wt 180.6 lb

## 2012-01-10 DIAGNOSIS — Z1211 Encounter for screening for malignant neoplasm of colon: Secondary | ICD-10-CM

## 2012-01-10 DIAGNOSIS — Z860100 Personal history of colon polyps, unspecified: Secondary | ICD-10-CM

## 2012-01-10 MED ORDER — PEG-KCL-NACL-NASULF-NA ASC-C 100 G PO SOLR: ORAL | Status: DC

## 2012-01-24 ENCOUNTER — Encounter: Payer: Self-pay | Admitting: Internal Medicine

## 2012-01-24 ENCOUNTER — Ambulatory Visit (AMBULATORY_SURGERY_CENTER): Payer: PRIVATE HEALTH INSURANCE | Admitting: Internal Medicine

## 2012-01-24 VITALS — BP 150/96 | HR 69 | Temp 95.4°F | Resp 17 | Ht 70.0 in | Wt 180.0 lb

## 2012-01-24 DIAGNOSIS — D126 Benign neoplasm of colon, unspecified: Secondary | ICD-10-CM

## 2012-01-24 DIAGNOSIS — Z1211 Encounter for screening for malignant neoplasm of colon: Secondary | ICD-10-CM

## 2012-01-24 DIAGNOSIS — Z86010 Personal history of colonic polyps: Secondary | ICD-10-CM

## 2012-01-24 MED ORDER — SODIUM CHLORIDE 0.9 % IV SOLN: 500.0000 mL | INTRAVENOUS | Status: DC

## 2012-01-24 NOTE — Op Note (Signed)
[facility] [address] [place], [place]  [address]  COLONOSCOPY PROCEDURE REPORT  PATIENT:  Eugene, Acevedo  MR#:  [id number] BIRTHDATE:  [birth year], 55 yrs. old  GENDER:  male ENDOSCOPIST:  Wilhemina Bonito. Eda Keys, MD REF. BY:  Surveillance Program Recall, PROCEDURE DATE:  01/24/2012 PROCEDURE:  Colonoscopy with snare polypectomy x 3 ASA CLASS:  Class II INDICATIONS:  history of pre-cancerous (adenomatous) colon polyps, surveillance and high-risk screening ; index 12-2006 w/ multiple small TAs and HPP MEDICATIONS:   MAC sedation, administered by CRNA, propofol (Diprivan) 400 mg IV  DESCRIPTION OF PROCEDURE:   After the risks benefits and alternatives of the procedure were thoroughly explained, informed consent was obtained.  Digital rectal exam was performed and revealed no abnormalities.   The LB CF-H180AL [id number] endoscope was introduced through the anus and advanced to the cecum, which was identified by both the appendix and ileocecal valve, without limitations.  The quality of the prep was excellent, using MoviPrep.  The instrument was then slowly withdrawn as the colon was fully examined. <<PROCEDUREIMAGES>>  FINDINGS:  Three polyps (4-50mm) were found in the transverse colon and snared without cautery. Retrieval was successful. Otherwise normal colonoscopy without other polyps, masses, vascular ectasias, or inflammatory changes.   Retroflexed views in the rectum revealed no abnormalities.    The time to cecum = 1:55 minutes. The scope was then withdrawn in 12:50  minutes from the cecum and the procedure completed.  COMPLICATIONS:  None  ENDOSCOPIC IMPRESSION: 1) Three polyps in the transverse colon - removed 2) Otherwise normal colonoscopy  RECOMMENDATIONS: 1) Follow up colonoscopy in 5 years  ______________________________ Wilhemina Bonito. Eda Keys, MD  CC:  The Patient;    Syliva Overman, MD  n. Rosalie Doctor:   Wilhemina Bonito. Eda Keys at 01/24/2012 12:52 PM  Aris Lot, [id number]

## 2012-01-24 NOTE — Progress Notes (Signed)
Patient did not have preoperative order for IV antibiotic SSI prophylaxis. (G8918)  Patient did not experience any of the following events: a burn prior to discharge; a fall within the facility; wrong site/side/patient/procedure/implant event; or a hospital transfer or hospital admission upon discharge from the facility. (G8907)  

## 2012-01-24 NOTE — Patient Instructions (Signed)

## 2012-01-24 NOTE — Progress Notes (Signed)
Per pt, "I want light sedation only."  He is instructed to Dr. Marina Goodell and the CRNA this and he voices understanding.

## 2012-01-25 ENCOUNTER — Telehealth: Payer: Self-pay | Admitting: *Deleted

## 2012-01-25 NOTE — Telephone Encounter (Signed)
Left message to call as needed. 

## 2012-01-27 ENCOUNTER — Ambulatory Visit (INDEPENDENT_AMBULATORY_CARE_PROVIDER_SITE_OTHER): Payer: PRIVATE HEALTH INSURANCE | Admitting: Family Medicine

## 2012-01-27 ENCOUNTER — Encounter: Payer: Self-pay | Admitting: Family Medicine

## 2012-01-27 VITALS — BP 118/80 | HR 87 | Resp 16 | Ht 70.0 in | Wt 179.0 lb

## 2012-01-27 DIAGNOSIS — M25512 Pain in left shoulder: Secondary | ICD-10-CM

## 2012-01-27 DIAGNOSIS — M25519 Pain in unspecified shoulder: Secondary | ICD-10-CM

## 2012-01-27 DIAGNOSIS — E739 Lactose intolerance, unspecified: Secondary | ICD-10-CM

## 2012-01-27 DIAGNOSIS — K219 Gastro-esophageal reflux disease without esophagitis: Secondary | ICD-10-CM

## 2012-01-27 DIAGNOSIS — Z125 Encounter for screening for malignant neoplasm of prostate: Secondary | ICD-10-CM

## 2012-01-27 DIAGNOSIS — I10 Essential (primary) hypertension: Secondary | ICD-10-CM

## 2012-01-27 DIAGNOSIS — F17200 Nicotine dependence, unspecified, uncomplicated: Secondary | ICD-10-CM

## 2012-01-27 DIAGNOSIS — E785 Hyperlipidemia, unspecified: Secondary | ICD-10-CM

## 2012-01-27 NOTE — Patient Instructions (Addendum)
F/u in 6 month, please call if you need to see me before  CBc, fasting chem 7, lipid , psa  Please cut back every month on cigarettes, by 1 each month  Please reduce beer to maximum of 2 per day.   It is important that you exercise regularly at least 30 minutes 5 times a week. If you develop chest pain, have severe difficulty breathing, or feel very tired, stop exercising immediately and seek medical attention   Avoid foods which cause heartburn

## 2012-01-27 NOTE — Progress Notes (Signed)
  Subjective:    Patient ID: Eugene Acevedo, male    DOB: [birth year], 56 y.o.   MRN: [id number]  HPI The PT is here for follow up and re-evaluation of chronic medical conditions, medication management and review of any available recent lab and radiology data.  Preventive health is updated, specifically  Cancer screening and Immunization.Pt had a colonoscopy 2 days ago, polyps were removed, he is on a 5 year schedule   The PT denies any adverse reactions to current medications since the last visit.  There are no new concerns.  There are no specific complaints Has occasional back ,and knee pain. The left shoulder is the most bothersome, but he "works with this"      Review of Systems See HPI Denies recent fever or chills. Denies sinus pressure, nasal congestion, ear pain or sore throat. Denies chest congestion, productive cough or wheezing. Denies chest pains, palpitations and leg swelling Denies abdominal pain, nausea, vomiting,diarrhea or constipation.   Denies dysuria, frequency, hesitancy or incontinence.  Denies headaches, seizures, numbness, or tingling. Denies depression,has mild  anxiety and  Insomnia.Uses xanax for help with this.Still drinks on average 4 beers daily Denies skin break down or rash.        Objective:   Physical Exam Patient alert and oriented and in no cardiopulmonary distress.  HEENT: No facial asymmetry, EOMI, no sinus tenderness,  oropharynx pink and moist.  Neck supple no adenopathy.  Chest: Clear to auscultation bilaterally.Decreased air entry bilaterally  CVS: S1, S2 no murmurs, no S3.  ABD: Soft non tender. Bowel sounds normal.  Ext: No edema  MS: Adequate ROM spine, , hips and knees.Decreased in left shoulder  Skin: Intact, no ulcerations or rash noted.  Psych: Good eye contact, blunted  affect. Memory intact not anxious or depressed appearing.  CNS: CN 2-12 intact, power, tone and sensation normal throughout.        Assessment  & Plan:

## 2012-01-28 NOTE — Assessment & Plan Note (Signed)
Controlled, no change in medication  

## 2012-01-28 NOTE — Assessment & Plan Note (Signed)
Unchanged, relies on pain medication, not interested in surgical intervention at this time

## 2012-01-28 NOTE — Assessment & Plan Note (Signed)
Unchanged, unwilling to set quit date but will try to reduce dependence

## 2012-01-28 NOTE — Assessment & Plan Note (Signed)
Dietary management only a this time per pt choice with improvement

## 2012-01-28 NOTE — Assessment & Plan Note (Signed)
Improved, states he is only symptomatic when he eats certain foods

## 2012-01-30 ENCOUNTER — Encounter: Payer: Self-pay | Admitting: Internal Medicine

## 2012-03-13 ENCOUNTER — Other Ambulatory Visit: Payer: Self-pay

## 2012-03-13 DIAGNOSIS — M549 Dorsalgia, unspecified: Secondary | ICD-10-CM

## 2012-03-13 MED ORDER — HYDROCODONE-ACETAMINOPHEN 5-500 MG PO TABS: 1.0000 | ORAL_TABLET | ORAL | Status: DC | PRN

## 2012-03-13 MED ORDER — ALPRAZOLAM 1 MG PO TABS: 1.0000 mg | ORAL_TABLET | Freq: Every day | ORAL | Status: DC

## 2012-04-28 ENCOUNTER — Other Ambulatory Visit: Payer: Self-pay | Admitting: Family Medicine

## 2012-07-24 ENCOUNTER — Ambulatory Visit: Payer: PRIVATE HEALTH INSURANCE | Admitting: Family Medicine

## 2012-09-10 ENCOUNTER — Ambulatory Visit (INDEPENDENT_AMBULATORY_CARE_PROVIDER_SITE_OTHER): Payer: PRIVATE HEALTH INSURANCE | Admitting: Family Medicine

## 2012-09-10 ENCOUNTER — Other Ambulatory Visit: Payer: Self-pay | Admitting: Family Medicine

## 2012-09-10 ENCOUNTER — Encounter: Payer: Self-pay | Admitting: Family Medicine

## 2012-09-10 VITALS — BP 114/70 | HR 78 | Resp 18 | Ht 70.0 in | Wt 178.1 lb

## 2012-09-10 DIAGNOSIS — N529 Male erectile dysfunction, unspecified: Secondary | ICD-10-CM

## 2012-09-10 DIAGNOSIS — F17200 Nicotine dependence, unspecified, uncomplicated: Secondary | ICD-10-CM

## 2012-09-10 DIAGNOSIS — E785 Hyperlipidemia, unspecified: Secondary | ICD-10-CM

## 2012-09-10 DIAGNOSIS — I10 Essential (primary) hypertension: Secondary | ICD-10-CM

## 2012-09-10 DIAGNOSIS — F411 Generalized anxiety disorder: Secondary | ICD-10-CM

## 2012-09-10 MED ORDER — LISINOPRIL-HYDROCHLOROTHIAZIDE 20-12.5 MG PO TABS: 2.0000 | ORAL_TABLET | Freq: Every day | ORAL | Status: DC

## 2012-09-10 MED ORDER — SILDENAFIL CITRATE 100 MG PO TABS: 100.0000 mg | ORAL_TABLET | ORAL | Status: DC | PRN

## 2012-09-10 NOTE — Patient Instructions (Signed)
F/u in 6 month, with rectal.  You will be contacted re  Labs.  Please plan to quit even the electric cigarette soon  Please l;imit beer to max of 2 per day.  Meds will be refilled for 6 month  Viagra 3 free coupon to be provided

## 2012-09-10 NOTE — Progress Notes (Signed)
  Subjective:    Patient ID: Eugene Acevedo, male    DOB: [birth year], 56 y.o.   MRN: [id number]  HPI The PT is here for follow up and re-evaluation of chronic medical conditions, medication management and review of any available recent lab and radiology data.  Preventive health is updated, specifically  Cancer screening and Immunization.   Questions or concerns regarding consultations or procedures which the PT has had in the interim are  addressed. The PT denies any adverse reactions to current medications since the last visit.  There are no new concerns.  There are no specific complaints       Review of Systems See HPI Denies recent fever or chills. Denies sinus pressure, nasal congestion, ear pain or sore throat. Denies chest congestion, productive cough or wheezing. Denies chest pains, palpitations and leg swelling Denies abdominal pain, nausea, vomiting,diarrhea or constipation.   Denies dysuria, frequency, hesitancy or incontinence. Denies joint pain, swelling and limitation in mobility. Denies headaches, seizures, numbness, or tingling. Denies depression, anxiety or insomnia. Denies skin break down or rash.        Objective:   Physical Exam Patient alert and oriented and in no cardiopulmonary distress.  HEENT: No facial asymmetry, EOMI, no sinus tenderness,  oropharynx pink and moist.  Neck supple no adenopathy.  Chest: Clear to auscultation bilaterally.Decreased though adequate air entry  CVS: S1, S2 no murmurs, no S3.  ABD: Soft non tender. Bowel sounds normal.  Ext: No edema  MS: Adequate ROM spine, shoulders, hips and knees.  Skin: Intact, no ulcerations or rash noted.  Psych: Good eye contact, normal affect. Memory intact not anxious or depressed appearing.  CNS: CN 2-12 intact, power, tone and sensation normal throughout.        Assessment & Plan:

## 2012-09-11 ENCOUNTER — Telehealth: Payer: Self-pay | Admitting: Family Medicine

## 2012-09-11 LAB — LIPID PANEL
Cholesterol: 229 mg/dL — ABNORMAL HIGH (ref 0–200)
LDL Cholesterol: 161 mg/dL — ABNORMAL HIGH (ref 0–99)
Total CHOL/HDL Ratio: 4.1 Ratio
VLDL: 12 mg/dL (ref 0–40)

## 2012-09-11 LAB — CBC
HCT: 47.1 % (ref 39.0–52.0)
Hemoglobin: 16.4 g/dL (ref 13.0–17.0)
MCHC: 34.8 g/dL (ref 30.0–36.0)
RBC: 5.13 MIL/uL (ref 4.22–5.81)
WBC: 7.5 10*3/uL (ref 4.0–10.5)

## 2012-09-11 LAB — BASIC METABOLIC PANEL
BUN: 11 mg/dL (ref 6–23)
Chloride: 102 mEq/L (ref 96–112)
Glucose, Bld: 105 mg/dL — ABNORMAL HIGH (ref 70–99)
Potassium: 4.3 mEq/L (ref 3.5–5.3)
Sodium: 140 mEq/L (ref 135–145)

## 2012-09-11 NOTE — Assessment & Plan Note (Signed)
Controlled, no change in medication  

## 2012-09-11 NOTE — Assessment & Plan Note (Signed)
Controlled, no change in medication DASH diet and commitment to daily physical activity for a minimum of 30 minutes discussed and encouraged, as a part of hypertension management. The importance of attaining a healthy weight is also discussed.  

## 2012-09-11 NOTE — Assessment & Plan Note (Addendum)
Hyperlipidemia:Low fat diet discussed and encouraged.  Updated labs to be reviewed, I recommend resumption of statin

## 2012-09-11 NOTE — Telephone Encounter (Signed)
I am recommending use of a statin for pt's cholesterl as well , this is a chane from wha is in his result note please let him know, fax in pravachol if he agrees pls

## 2012-09-11 NOTE — Assessment & Plan Note (Signed)
Longstanding complaint, script and coupon provided for 3 free Reduction of nicotine and alcohol also stressed

## 2012-09-11 NOTE — Assessment & Plan Note (Signed)
Using electric cigarettes.  Unwilling to set quit date. Patient counseled for approximately 5 minutes regarding the health risks of ongoing nicotine use, specifically all types of cancer, heart disease, stroke and respiratory failure. The options available for help with cessation ,the behavioral changes to assist the process, and the option to either gradully reduce usage  Or abruptly stop.is also discussed. Pt is also encouraged to set specific goals in number of cigarettes used daily, as well as to set a quit date.

## 2012-09-12 LAB — HEMOGLOBIN A1C: Mean Plasma Glucose: 105 mg/dL (ref <117)

## 2012-09-14 ENCOUNTER — Other Ambulatory Visit: Payer: Self-pay | Admitting: Family Medicine

## 2012-09-14 ENCOUNTER — Telehealth: Payer: Self-pay | Admitting: Family Medicine

## 2012-09-14 NOTE — Telephone Encounter (Signed)
noted 

## 2012-09-14 NOTE — Telephone Encounter (Signed)
Patient states that he would like to hold off on pravastatin at this time.

## 2012-09-14 NOTE — Telephone Encounter (Signed)
Will call patient back.

## 2012-09-19 ENCOUNTER — Other Ambulatory Visit: Payer: Self-pay | Admitting: Family Medicine

## 2013-02-15 ENCOUNTER — Other Ambulatory Visit: Payer: Self-pay

## 2013-02-15 MED ORDER — HYDROCODONE-ACETAMINOPHEN 5-325 MG PO TABS: 1.0000 | ORAL_TABLET | Freq: Every day | ORAL | Status: DC | PRN

## 2013-02-18 ENCOUNTER — Other Ambulatory Visit: Payer: Self-pay

## 2013-03-11 ENCOUNTER — Encounter: Payer: Self-pay | Admitting: Family Medicine

## 2013-03-11 ENCOUNTER — Ambulatory Visit (INDEPENDENT_AMBULATORY_CARE_PROVIDER_SITE_OTHER): Payer: BC Managed Care – PPO | Admitting: Family Medicine

## 2013-03-11 VITALS — BP 118/78 | HR 60 | Resp 16 | Ht 70.0 in | Wt 175.4 lb

## 2013-03-11 DIAGNOSIS — E785 Hyperlipidemia, unspecified: Secondary | ICD-10-CM

## 2013-03-11 DIAGNOSIS — N529 Male erectile dysfunction, unspecified: Secondary | ICD-10-CM

## 2013-03-11 DIAGNOSIS — I10 Essential (primary) hypertension: Secondary | ICD-10-CM

## 2013-03-11 DIAGNOSIS — L989 Disorder of the skin and subcutaneous tissue, unspecified: Secondary | ICD-10-CM

## 2013-03-11 DIAGNOSIS — F411 Generalized anxiety disorder: Secondary | ICD-10-CM

## 2013-03-11 DIAGNOSIS — M25512 Pain in left shoulder: Secondary | ICD-10-CM

## 2013-03-11 DIAGNOSIS — M25519 Pain in unspecified shoulder: Secondary | ICD-10-CM

## 2013-03-11 LAB — BASIC METABOLIC PANEL
BUN: 7 mg/dL (ref 6–23)
CO2: 27 mEq/L (ref 19–32)
Calcium: 9.5 mg/dL (ref 8.4–10.5)
Creat: 0.78 mg/dL (ref 0.50–1.35)
Glucose, Bld: 104 mg/dL — ABNORMAL HIGH (ref 70–99)

## 2013-03-11 LAB — LIPID PANEL: HDL: 54 mg/dL (ref >39)

## 2013-03-11 MED ORDER — ALPRAZOLAM 1 MG PO TABS: ORAL_TABLET | ORAL | Status: DC

## 2013-03-11 MED ORDER — HYDROCODONE-ACETAMINOPHEN 5-325 MG PO TABS: 1.0000 | ORAL_TABLET | Freq: Every day | ORAL | Status: DC | PRN

## 2013-03-11 MED ORDER — PAROXETINE HCL 10 MG PO TABS: 10.0000 mg | ORAL_TABLET | Freq: Every day | ORAL | Status: DC

## 2013-03-11 MED ORDER — LISINOPRIL-HYDROCHLOROTHIAZIDE 20-12.5 MG PO TABS: 2.0000 | ORAL_TABLET | Freq: Every day | ORAL | Status: DC

## 2013-03-11 NOTE — Progress Notes (Signed)
  Subjective:    Patient ID: Eugene Acevedo, male    DOB: March 07, 1956, 57 y.o.   MRN: [id number]  HPI The PT is here for follow up and re-evaluation of chronic medical conditions, medication management and review of any available recent lab and radiology data.  Preventive health is updated, specifically  Cancer screening and Immunization.    The PT denies any adverse reactions to current medications since the last visit.  C/o increased anxiety and stress driven by economic challenge, and is now separated from his spouse, reports beer generally 3 per night,only using electric cigarettes C/o increased Ed heard that the pump works well and wants urology referral for this C/o scalp lesion which itches, requests this get derm eval        Review of Systems See HPI Denies recent fever or chills. Denies sinus pressure, nasal congestion, ear pain or sore throat. Denies chest congestion, productive cough or wheezing. Denies chest pains, palpitations and leg swelling Denies abdominal pain, nausea, vomiting,diarrhea or constipation.   Denies dysuria, frequency, hesitancy or incontinence. Chronic shoulder and back pain unchanged Denies headaches, seizures, numbness, or tingling.        Objective:   Physical Exam  Patient alert and oriented and in no cardiopulmonary distress.  HEENT: No facial asymmetry, EOMI, no sinus tenderness,  oropharynx pink and moist.  Neck supple no adenopathy.  Chest: Clear to auscultation bilaterally.  CVS: S1, S2 no murmurs, no S3.  ABD: Soft non tender. Bowel sounds normal.  Ext: No edema  MS: decreased  ROM spine, shoulders, hips and knees.  Skin: Intact, no ulcerations or rash noted.  Psych: Good eye contact, normal affect. Memory intact not anxious or depressed appearing.  CNS: CN 2-12 intact, power, tone and sensation normal throughout.       Assessment & Plan:

## 2013-03-11 NOTE — Patient Instructions (Addendum)
F/u in 5.5 month, call if you need me before.  You are referred to skin specialist also to urology, call in 3 days if you do not get the appt info    Congrats on stopping smoking, also try to stop the electric cigarettes  Please cut back beer to max of 2 per night.  New medication paxil for anxiety, give it a least 2 weeks to see the difference   No other med changes at this  time

## 2013-03-23 NOTE — Assessment & Plan Note (Signed)
Increased will start paxil

## 2013-03-23 NOTE — Assessment & Plan Note (Signed)
Chronic skin lesion, pruritic , dem eval

## 2013-03-23 NOTE — Assessment & Plan Note (Signed)
Unchanged, pain meds as before, on as needed , basis

## 2013-03-23 NOTE — Assessment & Plan Note (Signed)
Hyperlipidemia:Low fat diet discussed and encouraged.  Improved with dietary management only

## 2013-03-23 NOTE — Assessment & Plan Note (Addendum)
Unchanged, requedsting pump for erections, will se urology in this regard

## 2013-03-23 NOTE — Assessment & Plan Note (Signed)
Controlled, no change in medication DASH diet and commitment to daily physical activity for a minimum of 30 minutes discussed and encouraged, as a part of hypertension management. The importance of attaining a healthy weight is also discussed.  

## 2013-05-17 ENCOUNTER — Other Ambulatory Visit: Payer: Self-pay | Admitting: Family Medicine

## 2013-06-04 ENCOUNTER — Telehealth: Payer: Self-pay | Admitting: Family Medicine

## 2013-06-04 NOTE — Telephone Encounter (Signed)
Called the pharamacy to refill his meds and they informed me that he still had refills left on both requested meds

## 2013-06-19 ENCOUNTER — Other Ambulatory Visit: Payer: Self-pay | Admitting: Family Medicine

## 2013-07-23 ENCOUNTER — Telehealth: Payer: Self-pay | Admitting: Family Medicine

## 2013-07-23 MED ORDER — ALPRAZOLAM 1 MG PO TABS: ORAL_TABLET | ORAL | Status: DC

## 2013-07-23 NOTE — Telephone Encounter (Signed)
Med refilled.

## 2013-09-10 ENCOUNTER — Ambulatory Visit (INDEPENDENT_AMBULATORY_CARE_PROVIDER_SITE_OTHER): Payer: BC Managed Care – PPO | Admitting: Family Medicine

## 2013-09-10 ENCOUNTER — Encounter (INDEPENDENT_AMBULATORY_CARE_PROVIDER_SITE_OTHER): Payer: Self-pay

## 2013-09-10 VITALS — BP 126/78 | HR 80 | Resp 18 | Ht 70.0 in | Wt 176.1 lb

## 2013-09-10 DIAGNOSIS — N529 Male erectile dysfunction, unspecified: Secondary | ICD-10-CM

## 2013-09-10 DIAGNOSIS — M25512 Pain in left shoulder: Secondary | ICD-10-CM

## 2013-09-10 DIAGNOSIS — Z125 Encounter for screening for malignant neoplasm of prostate: Secondary | ICD-10-CM

## 2013-09-10 DIAGNOSIS — E785 Hyperlipidemia, unspecified: Secondary | ICD-10-CM

## 2013-09-10 DIAGNOSIS — F411 Generalized anxiety disorder: Secondary | ICD-10-CM

## 2013-09-10 DIAGNOSIS — I10 Essential (primary) hypertension: Secondary | ICD-10-CM

## 2013-09-10 DIAGNOSIS — F17200 Nicotine dependence, unspecified, uncomplicated: Secondary | ICD-10-CM

## 2013-09-10 DIAGNOSIS — M25519 Pain in unspecified shoulder: Secondary | ICD-10-CM

## 2013-09-10 LAB — TSH: TSH: 1.365 u[IU]/mL (ref 0.350–4.500)

## 2013-09-10 LAB — BASIC METABOLIC PANEL
CO2: 29 mEq/L (ref 19–32)
Calcium: 9.6 mg/dL (ref 8.4–10.5)
Sodium: 136 mEq/L (ref 135–145)

## 2013-09-10 LAB — PSA: PSA: 0.64 ng/mL (ref <=4.00)

## 2013-09-10 LAB — LIPID PANEL: Total CHOL/HDL Ratio: 3.9 Ratio

## 2013-09-10 MED ORDER — ALPRAZOLAM 1 MG PO TABS: ORAL_TABLET | ORAL | Status: DC

## 2013-09-10 MED ORDER — PREDNISONE (PAK) 5 MG PO TABS: 5.0000 mg | ORAL_TABLET | ORAL | Status: DC

## 2013-09-10 MED ORDER — LISINOPRIL-HYDROCHLOROTHIAZIDE 20-12.5 MG PO TABS: 2.0000 | ORAL_TABLET | Freq: Every day | ORAL | Status: DC

## 2013-09-10 MED ORDER — HYDROCODONE-ACETAMINOPHEN 5-325 MG PO TABS: 1.0000 | ORAL_TABLET | Freq: Every day | ORAL | Status: DC | PRN

## 2013-09-10 NOTE — Patient Instructions (Addendum)
F/u in 5.5 month, call if you need me before  Work on stopping electric cigarette please  Rectal today shows no hiden blood in stool and is normal  Hydrocodone contract as discussed , anmd you are restricted to 12 tabs per month, therefore 36 per 12 weeks  PSA, lipid, chem 7 , tSH and PSa today  Prednisone prescribed for 6 days for shoulder pain

## 2013-09-10 NOTE — Progress Notes (Signed)
   Subjective:    Patient ID: Eugene Acevedo, male    DOB: [birth year], 57 y.o.   MRN: [id number]  HPI The PT is here for follow up and re-evaluation of chronic medical conditions, medication management and review of any available recent lab and radiology data.  Preventive health is updated, specifically  Cancer screening and Immunization.   Questions or concerns regarding consultations or procedures which the PT has had in the interim are  addressed. The PT denies any adverse reactions to current medications since the last visit.  There are no new concerns.  There are no specific complaints       Review of Systems See HPI Denies recent fever or chills. Denies sinus pressure, nasal congestion, ear pain or sore throat. Denies chest congestion, productive cough or wheezing. Denies chest pains, palpitations and leg swelling Denies abdominal pain, nausea, vomiting,diarrhea or constipation.   Denies dysuria, frequency, hesitancy or incontinence. Bilateral shoulder pain unchanged, with intermittent flares, currently experiencing a flare  Denies headaches, seizures, numbness, or tingling. Denies depression, uncontrolled  anxiety or insomnia. Denies skin break down or rash.        Objective:   Physical Exam  Patient alert and oriented and in no cardiopulmonary distress.  HEENT: No facial asymmetry, EOMI, no sinus tenderness,  oropharynx pink and moist.  Neck supple no adenopathy.  Chest: Clear to auscultation bilaterally.  CVS: S1, S2 no murmurs, no S3.  ABD: Soft non tender no organomegaly or mass palapble. Bowel sounds normal. Rectal: prostate smooth, not enlarged, stool is heme negative Ext: No edema  MS: Adequate ROM spine,  hips and knees.decreased ROM shoulder  Skin: Intact, no ulcerations or rash noted.  Psych: Good eye contact, normal affect. Memory intact not anxious or depressed appearing.  CNS: CN 2-12 intact, power, tone and sensation normal  throughout.       Assessment & Plan:

## 2013-09-15 ENCOUNTER — Encounter: Payer: Self-pay | Admitting: Family Medicine

## 2013-09-15 NOTE — Assessment & Plan Note (Signed)
Controlled, no change in medication DASH diet and commitment to daily physical activity for a minimum of 30 minutes discussed and encouraged, as a part of hypertension management. The importance of attaining a healthy weight is also discussed.  

## 2013-09-15 NOTE — Assessment & Plan Note (Signed)
Substituting with electric cigarette trying to stop

## 2013-09-15 NOTE — Assessment & Plan Note (Signed)
Unchanged, xanax at bedtime as before

## 2013-09-15 NOTE — Assessment & Plan Note (Signed)
Unchanged, viagra as needed

## 2013-09-15 NOTE — Assessment & Plan Note (Addendum)
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

## 2013-09-15 NOTE — Assessment & Plan Note (Signed)
Deteriorated and uncontrolled. resisting use of statin Hyperlipidemia:Low fat diet discussed and encouraged.

## 2013-11-12 ENCOUNTER — Telehealth: Payer: Self-pay

## 2013-11-12 MED ORDER — CLOTRIMAZOLE-BETAMETHASONE 1-0.05 % EX CREA: 1.0000 "application " | TOPICAL_CREAM | Freq: Two times a day (BID) | CUTANEOUS | Status: DC

## 2013-11-12 NOTE — Telephone Encounter (Signed)
Used to get a cream from here along time ago for rash and itchy skin. Wants a refill. It was lotrisone cream but I don't see it on his historic list.

## 2013-11-12 NOTE — Telephone Encounter (Signed)
That is clotrimaz/betameth, i sent in in let him know pls

## 2014-03-11 ENCOUNTER — Encounter: Payer: Self-pay | Admitting: Family Medicine

## 2014-03-11 ENCOUNTER — Ambulatory Visit (INDEPENDENT_AMBULATORY_CARE_PROVIDER_SITE_OTHER): Payer: BC Managed Care – PPO | Admitting: Family Medicine

## 2014-03-11 ENCOUNTER — Encounter (INDEPENDENT_AMBULATORY_CARE_PROVIDER_SITE_OTHER): Payer: Self-pay

## 2014-03-11 ENCOUNTER — Other Ambulatory Visit: Payer: Self-pay | Admitting: Family Medicine

## 2014-03-11 VITALS — BP 124/78 | HR 73 | Resp 16 | Ht 70.0 in | Wt 168.8 lb

## 2014-03-11 DIAGNOSIS — M25512 Pain in left shoulder: Secondary | ICD-10-CM

## 2014-03-11 DIAGNOSIS — E785 Hyperlipidemia, unspecified: Secondary | ICD-10-CM

## 2014-03-11 DIAGNOSIS — I10 Essential (primary) hypertension: Secondary | ICD-10-CM

## 2014-03-11 DIAGNOSIS — F17200 Nicotine dependence, unspecified, uncomplicated: Secondary | ICD-10-CM

## 2014-03-11 DIAGNOSIS — K3189 Other diseases of stomach and duodenum: Secondary | ICD-10-CM

## 2014-03-11 DIAGNOSIS — F411 Generalized anxiety disorder: Secondary | ICD-10-CM

## 2014-03-11 DIAGNOSIS — M25519 Pain in unspecified shoulder: Secondary | ICD-10-CM

## 2014-03-11 DIAGNOSIS — R1013 Epigastric pain: Secondary | ICD-10-CM

## 2014-03-11 LAB — BASIC METABOLIC PANEL
BUN: 10 mg/dL (ref 6–23)
CHLORIDE: 102 meq/L (ref 96–112)
CO2: 30 mEq/L (ref 19–32)
CREATININE: 0.82 mg/dL (ref 0.50–1.35)
Calcium: 9.7 mg/dL (ref 8.4–10.5)
Glucose, Bld: 108 mg/dL — ABNORMAL HIGH (ref 70–99)
POTASSIUM: 5 meq/L (ref 3.5–5.3)
Sodium: 139 mEq/L (ref 135–145)

## 2014-03-11 LAB — LIPID PANEL
CHOL/HDL RATIO: 3.4 ratio
CHOLESTEROL: 195 mg/dL (ref 0–200)
HDL: 58 mg/dL (ref >39)
LDL Cholesterol: 127 mg/dL — ABNORMAL HIGH (ref 0–99)
TRIGLYCERIDES: 49 mg/dL (ref <150)
VLDL: 10 mg/dL (ref 0–40)

## 2014-03-11 LAB — CBC WITH DIFFERENTIAL/PLATELET
BASOS PCT: 1 % (ref 0–1)
Basophils Absolute: 0.1 10*3/uL (ref 0.0–0.1)
EOS ABS: 0.1 10*3/uL (ref 0.0–0.7)
Eosinophils Relative: 2 % (ref 0–5)
HEMATOCRIT: 47.8 % (ref 39.0–52.0)
Hemoglobin: 16.6 g/dL (ref 13.0–17.0)
Lymphocytes Relative: 29 % (ref 12–46)
Lymphs Abs: 2.1 10*3/uL (ref 0.7–4.0)
MCH: 31.6 pg (ref 26.0–34.0)
MCHC: 34.7 g/dL (ref 30.0–36.0)
MCV: 90.9 fL (ref 78.0–100.0)
MONO ABS: 0.5 10*3/uL (ref 0.1–1.0)
Monocytes Relative: 7 % (ref 3–12)
NEUTROS PCT: 61 % (ref 43–77)
Neutro Abs: 4.5 10*3/uL (ref 1.7–7.7)
Platelets: 255 10*3/uL (ref 150–400)
RBC: 5.26 MIL/uL (ref 4.22–5.81)
RDW: 12.9 % (ref 11.5–15.5)
WBC: 7.4 10*3/uL (ref 4.0–10.5)

## 2014-03-11 MED ORDER — HYDROCODONE-ACETAMINOPHEN 5-325 MG PO TABS: 1.0000 | ORAL_TABLET | Freq: Every day | ORAL | Status: DC | PRN

## 2014-03-11 MED ORDER — ALPRAZOLAM 1 MG PO TABS: ORAL_TABLET | ORAL | Status: DC

## 2014-03-11 MED ORDER — LISINOPRIL-HYDROCHLOROTHIAZIDE 20-12.5 MG PO TABS: 2.0000 | ORAL_TABLET | Freq: Every day | ORAL | Status: DC

## 2014-03-11 MED ORDER — PAROXETINE HCL 10 MG PO TABS: 10.0000 mg | ORAL_TABLET | Freq: Every day | ORAL | Status: DC

## 2014-03-11 MED ORDER — KETOROLAC TROMETHAMINE 60 MG/2ML IM SOLN
60.0000 mg | Freq: Once | INTRAMUSCULAR | Status: AC
  Administered 2014-03-11: 60 mg via INTRAMUSCULAR

## 2014-03-11 MED ORDER — METHYLPREDNISOLONE ACETATE 80 MG/ML IJ SUSP
80.0000 mg | Freq: Once | INTRAMUSCULAR | Status: AC
  Administered 2014-03-11: 80 mg via INTRAMUSCULAR

## 2014-03-11 NOTE — Progress Notes (Signed)
   Subjective:    Patient ID: Eugene Acevedo, male    DOB: 1956-02-19, 58 y.o.   MRN: [id number]  HPI The PT is here for follow up and re-evaluation of chronic medical conditions, medication management and review of any available recent lab and radiology data.  Preventive health is updated, specifically  Cancer screening and Immunization.   The PT denies any adverse reactions to current medications since the last visit.      Review of Systems See HPI Denies recent fever or chills. Denies sinus pressure, nasal congestion, ear pain or sore throat. Denies chest congestion, productive cough or wheezing. Denies chest pains, palpitations and leg swelling Denies abdominal pain, nausea, vomiting,diarrhea or constipation.   Denies dysuria, frequency, hesitancy or incontinence. C/o left  shoulder pain and reduced mobility, requests shots in office for this. Denies headaches, seizures, numbness, or tingling. Denies depression, c/o i increased  anxiety and irritability, frustrated with separation process from his ex spouse who still lives in his home, uses xanax for insomnia. Denies skin break down or rash.        Objective:   Physical Exam BP 124/78  Pulse 73  Resp 16  Ht 5\' 10"  (1.778 m)  Wt 168 lb 12.8 oz (76.567 kg)  BMI 24.22 kg/m2  SpO2 100% Patient alert and oriented and in no cardiopulmonary distress.  HEENT: No facial asymmetry, EOMI,   oropharynx pink and moist.  Neck supple no JVD, no mass.  Chest: Clear to auscultation bilaterally.  CVS: S1, S2 no murmurs, no S3.  ABD: Soft non tender.   Ext: No edema  MS: Adequate ROM spine, , hips and knees.decreased ROM left  Shoulder normal in right  Skin: Intact, no ulcerations or rash noted.  Psych: Good eye contact, normal affect. Memory intact  anxious not  depressed appearing.  CNS: CN 2-12 intact, power,  normal throughout.no focal deficits noted.        Assessment & Plan:

## 2014-03-11 NOTE — Patient Instructions (Signed)
Annual physical exam in 6 month, call if you need me before  Please cut back on beer to 2 at night  New for anxiety is paxil take one every day , this will help  Injections in office today for shoulder pain   Lipid, cmp and CBC today  You will be referred for a high resolution chest CT scan due to over 30 pack year smoking history

## 2014-03-12 LAB — HEMOGLOBIN A1C
Hgb A1c MFr Bld: 5.3 % (ref <5.7)
Mean Plasma Glucose: 105 mg/dL (ref <117)

## 2014-03-12 LAB — H. PYLORI ANTIBODY, IGG

## 2014-03-17 NOTE — Assessment & Plan Note (Signed)
Controlled, no change in medication DASH diet and commitment to daily physical activity for a minimum of 30 minutes discussed and encouraged, as a part of hypertension management. The importance of attaining a healthy weight is also discussed.  

## 2014-03-17 NOTE — Assessment & Plan Note (Signed)
Pt has over 30 pack year history of nicotine use , needs annual screening for lung cancer , referral entered for same

## 2014-03-17 NOTE — Assessment & Plan Note (Signed)
Increased shoulder pain x 3 weeks, uses upper extremities a lot on the job, injections given in office

## 2014-03-17 NOTE — Assessment & Plan Note (Signed)
Improved , though still uncontrolled Dietary management  Only at this time, continue same  Hyperlipidemia:Low fat diet discussed and encouraged.

## 2014-03-20 ENCOUNTER — Telehealth: Payer: Self-pay | Admitting: Family Medicine

## 2014-03-20 NOTE — Telephone Encounter (Signed)
This is for high resolution chest ct

## 2014-03-23 NOTE — Telephone Encounter (Signed)
I will attempt to do direct peer to peer review on this

## 2014-03-26 ENCOUNTER — Telehealth: Payer: Self-pay | Admitting: Family Medicine

## 2014-03-26 DIAGNOSIS — Z87891 Personal history of nicotine dependence: Secondary | ICD-10-CM

## 2014-03-26 NOTE — Telephone Encounter (Signed)
I have called in  Problem stated was unknown what test was for when first requested, the reason for test is for lung cancer SCREENING I have put in a verbal request, case was closed and asked that you be contacted about this so please look out for fax/listen out for a call, I will re enter the order also

## 2014-03-26 NOTE — Telephone Encounter (Signed)
Yes I did tell the lady but that's fine I am glad you got this done Thank you

## 2014-03-26 NOTE — Assessment & Plan Note (Signed)
Increased and uncontrolled, add paxil

## 2014-03-27 ENCOUNTER — Other Ambulatory Visit: Payer: Self-pay | Admitting: Family Medicine

## 2014-03-27 ENCOUNTER — Other Ambulatory Visit: Payer: Self-pay

## 2014-03-27 DIAGNOSIS — Z87891 Personal history of nicotine dependence: Secondary | ICD-10-CM

## 2014-03-27 NOTE — Telephone Encounter (Signed)
auth # [id number] left message for patient to call back to get appointment for this

## 2014-03-27 NOTE — Telephone Encounter (Signed)
Patient is aware of this appointment

## 2014-04-02 ENCOUNTER — Ambulatory Visit (HOSPITAL_COMMUNITY)
Admission: RE | Admit: 2014-04-02 | Discharge: 2014-04-02 | Disposition: A | Discharge status: Home / Self Care | Payer: BC Managed Care – PPO | Source: Ambulatory Visit | Attending: Family Medicine | Admitting: Family Medicine

## 2014-04-02 DIAGNOSIS — Z87891 Personal history of nicotine dependence: Secondary | ICD-10-CM | POA: Insufficient documentation

## 2014-04-02 DIAGNOSIS — J438 Other emphysema: Secondary | ICD-10-CM | POA: Insufficient documentation

## 2014-04-02 DIAGNOSIS — I7090 Unspecified atherosclerosis: Secondary | ICD-10-CM | POA: Insufficient documentation

## 2014-04-02 DIAGNOSIS — R918 Other nonspecific abnormal finding of lung field: Secondary | ICD-10-CM | POA: Insufficient documentation

## 2014-04-22 ENCOUNTER — Other Ambulatory Visit: Payer: Self-pay | Admitting: Family Medicine

## 2014-05-06 ENCOUNTER — Other Ambulatory Visit: Payer: Self-pay

## 2014-05-06 ENCOUNTER — Telehealth: Payer: Self-pay | Admitting: Family Medicine

## 2014-05-06 ENCOUNTER — Other Ambulatory Visit: Payer: Self-pay | Admitting: Family Medicine

## 2014-05-06 DIAGNOSIS — Z87891 Personal history of nicotine dependence: Secondary | ICD-10-CM

## 2014-05-06 DIAGNOSIS — I10 Essential (primary) hypertension: Secondary | ICD-10-CM

## 2014-05-06 DIAGNOSIS — E785 Hyperlipidemia, unspecified: Secondary | ICD-10-CM

## 2014-05-06 MED ORDER — PRAVASTATIN SODIUM 40 MG PO TABS: 40.0000 mg | ORAL_TABLET | Freq: Every day | ORAL | Status: DC

## 2014-05-06 NOTE — Telephone Encounter (Signed)
Thanks

## 2014-05-06 NOTE — Telephone Encounter (Signed)
Patient will be seen on 8/7 at cardiology.   Pravastatin resent to pharmacy.

## 2014-05-06 NOTE — Telephone Encounter (Signed)
Tele msg sent to [facility]

## 2014-05-06 NOTE — Telephone Encounter (Signed)
Pls send in pravachol to pt's pharmacy, he is going today His recent chest scan suggests CAD, there is no EKG on file, he is referred to cardiology, has agreed, but iTam uncertain how soon this will be , most likely not for 4 to 6 weeks. He does need to come in and get an EKG done asap, so a baseline is established , even though he has no symptoms of chest pain, he needs an EKG done This is important His # is [phone]  ??? pls ask

## 2014-05-09 ENCOUNTER — Encounter: Payer: Self-pay | Admitting: Internal Medicine

## 2014-05-09 ENCOUNTER — Ambulatory Visit (INDEPENDENT_AMBULATORY_CARE_PROVIDER_SITE_OTHER): Payer: BC Managed Care – PPO | Admitting: Internal Medicine

## 2014-05-09 VITALS — BP 104/64 | HR 71 | Ht 70.0 in | Wt 165.0 lb

## 2014-05-09 DIAGNOSIS — I10 Essential (primary) hypertension: Secondary | ICD-10-CM

## 2014-05-09 NOTE — Progress Notes (Signed)
HPI Patient is a 58 yo who is referred for evaluation of abnormal chest CT   Hx of HTN, hyperlipidemia, L shoulder pain  Patient had a chest CT in June taht showed calcifications of LAD and RCA and aorta.   He denies CP  No dizziness  No SOB Shay he is very active No Known Allergies  Current Outpatient Prescriptions  Medication Sig Dispense Refill  . ALPRAZolam (XANAX) 1 MG tablet TAKE 1 TABLET BY MOUTH AT BEDTIME  90 tablet  0  . aspirin EC 81 MG tablet Take 1 tablet (81 mg total) by mouth daily.  150 tablet  2  . clotrimazole-betamethasone (LOTRISONE) cream Apply 1 application topically 2 (two) times daily.  45 g  1  . HYDROcodone-acetaminophen (NORCO) 5-325 MG per tablet Take 1 tablet by mouth daily as needed. Not to exceed 3 tabs/week To last 12 weeks  36 tablet  0  . lisinopril-hydrochlorothiazide (PRINZIDE,ZESTORETIC) 20-12.5 MG per tablet Take 2 tablets by mouth daily.  180 tablet  1  . PARoxetine (PAXIL) 10 MG tablet Take 1 tablet (10 mg total) by mouth daily.  30 tablet  4  . pravastatin (PRAVACHOL) 40 MG tablet Take 1 tablet (40 mg total) by mouth daily.  30 tablet  5   No current facility-administered medications for this visit.    Past Medical History  Diagnosis Date  . Hypertension   . GERD (gastroesophageal reflux disease)   . Erectile dysfunction   . Anxiety   . Nicotine addiction   . Hyperlipidemia     no meds now  . Ulcer     Past Surgical History  Procedure Laterality Date  . Polypectomy    . Colonoscopy    . Upper gastrointestinal endoscopy      Family History  Problem Relation Age of Onset  . Hypertension Mother   . Hypertension Father     DJD   . Colon cancer Neg Hx   . Esophageal cancer Neg Hx   . Rectal cancer Neg Hx   . Stomach cancer Neg Hx     History   Social History  . Marital Status: Married    Spouse Name: N/A    Number of Children: N/A  . Years of Education: N/A   Occupational History  . Dealer     Social History Main  Topics  . Smoking status: Former Smoker -- 0.50 packs/day    Types: Cigarettes    Quit date: 11/07/2012  . Smokeless tobacco: Not on file  . Alcohol Use: 12.6 oz/week    21 Cans of beer per week  . Drug Use: No  . Sexual Activity: Not on file   Other Topics Concern  . Not on file   Social History Narrative  . No narrative on file    Review of Systems:  All systems reviewed.  They are negative to the above problem except as previously stated.  Vital Signs: BP 104/64  Pulse 71  Ht 5\' 10"  (1.778 m)  Wt 165 lb (74.844 kg)  BMI 23.68 kg/m2  Physical Exam Patient is in NAD  HEENT:  Normocephalic, atraumatic. EOMI, PERRLA.  Neck: JVP is normal.  No bruits.  Lungs: clear to auscultation. No rales no wheezes.  Heart: Regular rate and rhythm. Normal S1, S2. No S3.   No significant murmurs. PMI not displaced.  Abdomen:  Supple, nontender. Normal bowel sounds. No masses. No hepatomegaly.  Extremities:   Good distal pulses throughout. No lower extremity edema.  Musculoskeletal :moving all extremities.  Neuro:   alert and oriented x3.  CN II-XII grossly intact.  EKG  SB 55 bpm.  rSR' V1 Assessment and Plan:  1.  CAD  Patient with evid of CAD on chest CT  He denies symptoms  Says he is actvie Would follow  Discussed symptoms to look out for Aggressively treat lipids   F?u in spring  2.  HL  He started pravastatin 3 days ago  Goal is LDL at 70  Will check in 2 months    3.  Tob  Congratulated on his quitting

## 2014-05-09 NOTE — Patient Instructions (Addendum)
Your physician recommends that you schedule a follow-up appointment in: March   Your physician recommends that you return for lab work in October. LIPIDS  Fasting- nothing to eat or drink after midnight.

## 2014-05-30 ENCOUNTER — Telehealth: Payer: Self-pay | Admitting: Family Medicine

## 2014-05-30 DIAGNOSIS — F17219 Nicotine dependence, cigarettes, with unspecified nicotine-induced disorders: Secondary | ICD-10-CM

## 2014-05-30 DIAGNOSIS — R918 Other nonspecific abnormal finding of lung field: Secondary | ICD-10-CM

## 2014-05-30 NOTE — Telephone Encounter (Signed)
Referral entered for pulmonary, le Bauer pls sched appt and let him know

## 2014-05-30 NOTE — Telephone Encounter (Signed)
Noted  

## 2014-06-04 ENCOUNTER — Ambulatory Visit (INDEPENDENT_AMBULATORY_CARE_PROVIDER_SITE_OTHER): Payer: BC Managed Care – PPO | Admitting: Internal Medicine

## 2014-06-04 ENCOUNTER — Encounter: Payer: Self-pay | Admitting: Internal Medicine

## 2014-06-04 VITALS — BP 120/70 | HR 67 | Temp 98.5°F | Ht 70.0 in | Wt 172.0 lb

## 2014-06-04 DIAGNOSIS — R918 Other nonspecific abnormal finding of lung field: Secondary | ICD-10-CM | POA: Insufficient documentation

## 2014-06-04 NOTE — Patient Instructions (Signed)
We will call you after the first of the year to be sure you get a follow up CT chest   Call us in meantime if develop cough, chest pain with breathing or short of breath

## 2014-06-04 NOTE — Assessment & Plan Note (Signed)
See CT chest 04/02/14 - risk factors prob asbestos exposure/ neg fm hx/quit smoking 2014 > recall for 10/03/14  Discussed in detail all the  indications, usual  risks and alternatives  relative to the benefits with patient who agrees to proceed with conservative non - contrasted CT chest in Jan 2016 then probably yearly x 2.5 years thereafter - earlier f/u if indicated by symptoms

## 2014-06-04 NOTE — Progress Notes (Signed)
   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  No   chronic cough or cp or chest tightness, subjective wheeze overt sinus or hb symptoms. No unusual exp hx or h/o childhood pna/ asthma or knowledge of premature birth.  Sleeping ok without nocturnal  or early am exacerbation  of respiratory  c/o's or need for noct saba. Also denies any obvious fluctuation of symptoms with weather or environmental changes or other aggravating or alleviating factors except as outlined above   Current Medications, Allergies, Complete Past Medical History, Past Surgical History, Family History, and Social History were reviewed in [employer] record.      Review of Systems  Constitutional: Negative for fever, chills, activity change, appetite change and unexpected weight change.  HENT: Negative for congestion, dental problem, postnasal drip, rhinorrhea, sneezing, sore throat, trouble swallowing and voice change.   Eyes: Negative for visual disturbance.  Respiratory: Negative for cough, choking and shortness of breath.   Cardiovascular: Negative for chest pain and leg swelling.  Gastrointestinal: Negative for nausea, vomiting and abdominal pain.  Genitourinary: Negative for difficulty urinating.       Heartburn  Musculoskeletal: Negative for arthralgias.  Skin: Negative for rash.  Psychiatric/Behavioral: Negative for behavioral problems and confusion.       Objective:   Physical Exam  Wt Readings from Last 3 Encounters:  06/04/14 172 lb (78.019 kg)  05/09/14 165 lb (74.844 kg)  03/11/14 168 lb 12.8 oz (76.567 kg)      HEENT: nl  dentition, turbinates, and orophanx. Nl external ear canals without cough reflex   NECK :  without JVD/Nodes/TM/ nl carotid upstrokes bilaterally   LUNGS: no acc muscle use, clear to A and P bilaterally without cough on insp or exp maneuvers   CV:  RRR  no s3 or murmur or increase in P2, no edema   ABD:  soft and nontender with nl excursion in the supine position. No bruits or organomegaly, bowel sounds nl  MS:  warm without deformities, calf tenderness, cyanosis or clubbing  SKIN: warm and dry without lesions    NEURO:  alert, approp, no deficits    CT chest 04/02/14  Scattered tiny subcentimeter pulmonary nodules in the lungs  bilaterally, largest of which measures only 5 mm in the subpleural  aspect of the right middle lobe. Given the patient's smoking  history, the patient is at high risk for bronchogenic carcinoma, and  a follow-up chest CT at 6-12 months is recommended.      Assessment & Plan:

## 2014-07-29 ENCOUNTER — Other Ambulatory Visit: Payer: Self-pay

## 2014-07-29 LAB — LIPID PANEL
CHOL/HDL RATIO: 3.5 ratio
Cholesterol: 200 mg/dL (ref 0–200)
HDL: 57 mg/dL (ref >39)
LDL Cholesterol: 124 mg/dL — ABNORMAL HIGH (ref 0–99)
Triglycerides: 93 mg/dL (ref <150)
VLDL: 19 mg/dL (ref 0–40)

## 2014-07-29 MED ORDER — HYDROCODONE-ACETAMINOPHEN 5-325 MG PO TABS: 1.0000 | ORAL_TABLET | Freq: Every day | ORAL | Status: DC | PRN

## 2014-07-31 ENCOUNTER — Telehealth: Payer: Self-pay | Admitting: *Deleted

## 2014-07-31 DIAGNOSIS — E785 Hyperlipidemia, unspecified: Secondary | ICD-10-CM

## 2014-07-31 MED ORDER — ATORVASTATIN CALCIUM 20 MG PO TABS: 20.0000 mg | ORAL_TABLET | Freq: Every day | ORAL | Status: DC

## 2014-07-31 NOTE — Telephone Encounter (Signed)
Pt is calling for lab results 

## 2014-07-31 NOTE — Telephone Encounter (Signed)
Spoke with patient, he is willing to try Lipitor 20 mg daily.He states he was not taking Pravachol as he should.trying to drink vinegar everyday too.   Mailed pt lab slips

## 2014-09-03 ENCOUNTER — Ambulatory Visit (INDEPENDENT_AMBULATORY_CARE_PROVIDER_SITE_OTHER): Payer: BC Managed Care – PPO | Admitting: Family Medicine

## 2014-09-03 ENCOUNTER — Encounter: Payer: Self-pay | Admitting: Family Medicine

## 2014-09-03 VITALS — BP 118/74 | HR 82 | Resp 16 | Ht 70.0 in | Wt 174.4 lb

## 2014-09-03 DIAGNOSIS — Z1211 Encounter for screening for malignant neoplasm of colon: Secondary | ICD-10-CM

## 2014-09-03 DIAGNOSIS — I10 Essential (primary) hypertension: Secondary | ICD-10-CM

## 2014-09-03 DIAGNOSIS — Z125 Encounter for screening for malignant neoplasm of prostate: Secondary | ICD-10-CM

## 2014-09-03 DIAGNOSIS — Z0000 Encounter for general adult medical examination without abnormal findings: Secondary | ICD-10-CM | POA: Insufficient documentation

## 2014-09-03 DIAGNOSIS — Z23 Encounter for immunization: Secondary | ICD-10-CM

## 2014-09-03 LAB — POC HEMOCCULT BLD/STL (OFFICE/1-CARD/DIAGNOSTIC): FECAL OCCULT BLD: NEGATIVE

## 2014-09-03 MED ORDER — KETOCONAZOLE 2 % EX CREA: 1.0000 "application " | TOPICAL_CREAM | Freq: Every day | CUTANEOUS | Status: DC

## 2014-09-03 NOTE — Patient Instructions (Addendum)
F/u inn 5.5 month, call if you need me before  CMP , PSA and TSH  end December when you are having blood work for Dr Harrington Challenger, pls provide them with same sheet  Cialis prescription is sent to [facility], check there  Continuie top stop smopking and cut back on alcohol  Prevnar today, rectal exam shows no hidden  blood

## 2014-09-03 NOTE — Assessment & Plan Note (Signed)
Vaccine administered at visit.  

## 2014-09-03 NOTE — Assessment & Plan Note (Signed)

## 2014-09-03 NOTE — Progress Notes (Signed)
   Subjective:    Patient ID: Eugene Acevedo, male    DOB: 07-Dec-1955, 58 y.o.   MRN: [id number]  HPI  Patient is in for annual physical exam. No other health concerns are expressed or addressed at the visit.    Review of Systems See HPI     Objective:   Physical Exam  BP 118/74 mmHg  Pulse 82  Resp 16  Ht 5\' 10"  (1.778 m)  Wt 174 lb 6.4 oz (79.107 kg)  BMI 25.02 kg/m2  SpO2 98%  Pleasant well nourished male, alert and oriented x 3, in no cardio-pulmonary distress. Afebrile. HEENT No facial trauma or asymetry. Sinuses non tender. EOMI, PERTL, fundoscopic exam is negative for hemorhages or exudates. External ears normal, tympanic membranes clear. Oropharynx moist, no exudate, fairly  good dentition. Neck: supple, no adenopathy,JVD or thyromegaly.No bruits.  Chest: Clear to ascultation bilaterally.No crackles or wheezes. Non tender to palpation  Breast: No asymetry,no masses. No nipple discharge or inversion. No axillary or supraclavicular adenopathy  Cardiovascular system; Heart sounds normal,  S1 and  S2 ,no S3.  No murmur, or thrill. Apical beat not displaced Peripheral pulses normal.  Abdomen: Soft, non tender, no organomegaly or masses. No bruits. Bowel sounds normal. No guarding, tenderness or rebound.  Rectal:  Normal sphincter tone. No hemorrhoids or  masses. guaiac negative stool. Prostate smooth and firm  GU: No penile lesion or discharge. No testicular mass or tenderness.  Musculoskeletal exam: Full ROM of spine, hips , shoulders and knees. No deformity ,swelling or crepitus noted. No muscle wasting or atrophy.   Neurologic: Cranial nerves 2 to 12 intact. Power, tone ,sensation and reflexes normal throughout. No disturbance in gait. No tremor.  Skin: Intact, no ulceration, erythema , scaling or rash noted. Pigmentation normal throughout  Psych; Normal mood and affect. Judgement and concentration normal       Assessment &  Plan:  Annual physical exam Annual exam as documented. Counseling done  re healthy lifestyle involving commitment to 150 minutes exercise per week, heart healthy diet, and attaining healthy weight.The importance of adequate sleep also discussed. Regular seat belt use and home safety, is also discussed. Changes in health habits are decided on by the patient with goals and time frames  set for achieving them. Immunization and cancer screening needs are specifically addressed at this visit.   Need for Streptococcus pneumoniae vaccination Vaccine administered at visit.

## 2014-09-12 ENCOUNTER — Other Ambulatory Visit: Payer: Self-pay | Admitting: Family Medicine

## 2014-09-15 ENCOUNTER — Other Ambulatory Visit: Payer: Self-pay | Admitting: Family Medicine

## 2014-09-23 ENCOUNTER — Other Ambulatory Visit: Payer: Self-pay | Admitting: Internal Medicine

## 2014-09-23 DIAGNOSIS — R918 Other nonspecific abnormal finding of lung field: Secondary | ICD-10-CM

## 2014-09-30 LAB — COMPREHENSIVE METABOLIC PANEL
ALBUMIN: 4.4 g/dL (ref 3.5–5.2)
ALT: 17 U/L (ref 0–53)
AST: 16 U/L (ref 0–37)
Alkaline Phosphatase: 98 U/L (ref 39–117)
BILIRUBIN TOTAL: 0.5 mg/dL (ref 0.2–1.2)
BUN: 14 mg/dL (ref 6–23)
CO2: 30 meq/L (ref 19–32)
Calcium: 9.6 mg/dL (ref 8.4–10.5)
Chloride: 102 mEq/L (ref 96–112)
Creat: 0.93 mg/dL (ref 0.50–1.35)
GLUCOSE: 101 mg/dL — AB (ref 70–99)
Potassium: 4.5 mEq/L (ref 3.5–5.3)
Sodium: 139 mEq/L (ref 135–145)
TOTAL PROTEIN: 7.1 g/dL (ref 6.0–8.3)

## 2014-09-30 LAB — TSH: TSH: 1.332 u[IU]/mL (ref 0.350–4.500)

## 2014-09-30 LAB — HEPATIC FUNCTION PANEL
ALK PHOS: 98 U/L (ref 39–117)
ALT: 18 U/L (ref 0–53)
AST: 16 U/L (ref 0–37)
Albumin: 4.5 g/dL (ref 3.5–5.2)
BILIRUBIN INDIRECT: 0.4 mg/dL (ref 0.2–1.2)
BILIRUBIN TOTAL: 0.5 mg/dL (ref 0.2–1.2)
Bilirubin, Direct: 0.1 mg/dL (ref 0.0–0.3)
Total Protein: 7.1 g/dL (ref 6.0–8.3)

## 2014-09-30 LAB — LIPID PANEL
Cholesterol: 166 mg/dL (ref 0–200)
HDL: 49 mg/dL (ref >39)
LDL Cholesterol: 101 mg/dL — ABNORMAL HIGH (ref 0–99)
Total CHOL/HDL Ratio: 3.4 Ratio
Triglycerides: 79 mg/dL (ref <150)
VLDL: 16 mg/dL (ref 0–40)

## 2014-10-01 LAB — PSA: PSA: 0.75 ng/mL (ref <=4.00)

## 2014-10-08 ENCOUNTER — Ambulatory Visit (INDEPENDENT_AMBULATORY_CARE_PROVIDER_SITE_OTHER)
Admission: RE | Admit: 2014-10-08 | Discharge: 2014-10-08 | Disposition: A | Discharge status: Home / Self Care | Payer: 59 | Source: Ambulatory Visit | Attending: Internal Medicine | Admitting: Internal Medicine

## 2014-10-08 DIAGNOSIS — R918 Other nonspecific abnormal finding of lung field: Secondary | ICD-10-CM

## 2014-10-09 NOTE — Progress Notes (Signed)
Quick Note:  Pt aware of results and recs. Nothing further needed. ______

## 2014-10-21 ENCOUNTER — Other Ambulatory Visit: Payer: Self-pay | Admitting: *Deleted

## 2014-10-21 DIAGNOSIS — E785 Hyperlipidemia, unspecified: Secondary | ICD-10-CM

## 2014-11-22 ENCOUNTER — Other Ambulatory Visit: Payer: Self-pay | Admitting: Family Medicine

## 2014-12-01 ENCOUNTER — Other Ambulatory Visit (INDEPENDENT_AMBULATORY_CARE_PROVIDER_SITE_OTHER): Payer: 59 | Admitting: *Deleted

## 2014-12-01 DIAGNOSIS — E785 Hyperlipidemia, unspecified: Secondary | ICD-10-CM

## 2014-12-01 LAB — LIPID PANEL
Cholesterol: 166 mg/dL (ref 0–200)
HDL: 53.9 mg/dL (ref >39.00)
LDL Cholesterol: 99 mg/dL (ref 0–99)
NONHDL: 112.1
TRIGLYCERIDES: 67 mg/dL (ref 0.0–149.0)
Total CHOL/HDL Ratio: 3
VLDL: 13.4 mg/dL (ref 0.0–40.0)

## 2014-12-01 LAB — AST: AST: 19 U/L (ref 0–37)

## 2014-12-01 NOTE — Addendum Note (Signed)
Addended by: Eulis Foster on: 12/01/2014 08:19 AM   Modules accepted: Orders

## 2014-12-05 ENCOUNTER — Telehealth: Payer: Self-pay | Admitting: *Deleted

## 2014-12-05 DIAGNOSIS — E785 Hyperlipidemia, unspecified: Secondary | ICD-10-CM

## 2014-12-05 MED ORDER — ATORVASTATIN CALCIUM 40 MG PO TABS: 40.0000 mg | ORAL_TABLET | Freq: Every day | ORAL | Status: DC

## 2014-12-05 NOTE — Telephone Encounter (Signed)
Notes Recorded by Fay Records, MD on 12/03/2014 at 9:59 AM CT scan from last summer showed plaque on aorta and coronary arteries Lipid panel shows LDL 99 He is on Lipitor 20 I would increase to 40 and follow up labs in 3 months (lipids, AST)  Discussed with patient.  He will take 40 mg daily. Has appointment with Dr. Harrington Challenger in June, will have repeat labs at that time.  Has a concern that lipitor is making muscles ache. Instructed that if so, the higher dose will likely make the aches worse, and if that happens to call back to discuss. Verbalizes understanding and agreement.

## 2014-12-14 ENCOUNTER — Other Ambulatory Visit: Payer: Self-pay | Admitting: Family Medicine

## 2015-03-11 ENCOUNTER — Encounter: Payer: Self-pay | Admitting: Internal Medicine

## 2015-03-19 ENCOUNTER — Other Ambulatory Visit: Payer: Self-pay

## 2015-03-19 ENCOUNTER — Ambulatory Visit (INDEPENDENT_AMBULATORY_CARE_PROVIDER_SITE_OTHER): Payer: 59 | Admitting: Internal Medicine

## 2015-03-19 VITALS — BP 142/82 | HR 58 | Ht 70.0 in | Wt 177.0 lb

## 2015-03-19 DIAGNOSIS — Z136 Encounter for screening for cardiovascular disorders: Secondary | ICD-10-CM

## 2015-03-19 DIAGNOSIS — I10 Essential (primary) hypertension: Secondary | ICD-10-CM | POA: Diagnosis not present

## 2015-03-19 DIAGNOSIS — E785 Hyperlipidemia, unspecified: Secondary | ICD-10-CM

## 2015-03-19 LAB — CBC WITH DIFFERENTIAL/PLATELET
Basophils Absolute: 0.1 10*3/uL (ref 0.0–0.1)
Basophils Relative: 1 % (ref 0–1)
Eosinophils Absolute: 0.3 10*3/uL (ref 0.0–0.7)
Eosinophils Relative: 4 % (ref 0–5)
HEMATOCRIT: 45.2 % (ref 39.0–52.0)
HEMOGLOBIN: 15.6 g/dL (ref 13.0–17.0)
LYMPHS ABS: 2 10*3/uL (ref 0.7–4.0)
LYMPHS PCT: 30 % (ref 12–46)
MCH: 31 pg (ref 26.0–34.0)
MCHC: 34.5 g/dL (ref 30.0–36.0)
MCV: 89.9 fL (ref 78.0–100.0)
MPV: 9.2 fL (ref 8.6–12.4)
Monocytes Absolute: 0.4 10*3/uL (ref 0.1–1.0)
Monocytes Relative: 6 % (ref 3–12)
NEUTROS PCT: 59 % (ref 43–77)
Neutro Abs: 3.9 10*3/uL (ref 1.7–7.7)
Platelets: 222 10*3/uL (ref 150–400)
RBC: 5.03 MIL/uL (ref 4.22–5.81)
RDW: 13.1 % (ref 11.5–15.5)
WBC: 6.6 10*3/uL (ref 4.0–10.5)

## 2015-03-19 LAB — BASIC METABOLIC PANEL
BUN: 11 mg/dL (ref 6–23)
CALCIUM: 9.5 mg/dL (ref 8.4–10.5)
CO2: 28 mEq/L (ref 19–32)
Chloride: 101 mEq/L (ref 96–112)
Creat: 0.8 mg/dL (ref 0.50–1.35)
Glucose, Bld: 99 mg/dL (ref 70–99)
Potassium: 4.2 mEq/L (ref 3.5–5.3)
Sodium: 137 mEq/L (ref 135–145)

## 2015-03-19 LAB — LIPID PANEL
CHOL/HDL RATIO: 2.5 ratio
Cholesterol: 162 mg/dL (ref 0–200)
HDL: 64 mg/dL (ref >=40)
LDL CALC: 84 mg/dL (ref 0–99)
Triglycerides: 68 mg/dL (ref <150)
VLDL: 14 mg/dL (ref 0–40)

## 2015-03-19 LAB — AST: AST: 15 U/L (ref 0–37)

## 2015-03-19 MED ORDER — HYDROCODONE-ACETAMINOPHEN 5-325 MG PO TABS: 1.0000 | ORAL_TABLET | Freq: Every day | ORAL | Status: DC | PRN

## 2015-03-19 NOTE — Patient Instructions (Signed)
Your physician recommends that you schedule a follow-up appointment As needed   Your physician recommends that you continue on your current medications as directed. Please refer to the Current Medication list given to you today.  Your physician recommends that you return for lab work in: Today  Thank you for choosing [facility]!

## 2015-03-19 NOTE — Progress Notes (Signed)
Cardiology Office Note   Date:  03/19/2015   ID:  GARRETT MITCHUM, DOB 1956/01/03, MRN [id number]  PCP:  Tula Nakayama, MD  Cardiologist:   Dorris Carnes, MD   No chief complaint on file.  F/U of CAD     History of Present Illness: Eugene Acevedo is a 59 y.o. male with a history of coronary calcifications on CT (of LAD and RCA and aorta), HTN, HL  I saw him in AUgust 2015  Reeder on statin Last LDL in March was 99  I increased lipitor to 49    Since seen he has done well  He denies CP  No SOB  Walks   Was achy in joints when started lipitor but this has improved     Current Outpatient Prescriptions  Medication Sig Dispense Refill  . ALPRAZolam (XANAX) 1 MG tablet TAKE 1 TABLET BY MOUTH AT BEDTIME 90 tablet 1  . aspirin EC 81 MG tablet Take 1 tablet (81 mg total) by mouth daily. 150 tablet 2  . atorvastatin (LIPITOR) 40 MG tablet Take 1 tablet (40 mg total) by mouth daily. 30 tablet 6  . clotrimazole-betamethasone (LOTRISONE) cream Apply 1 application topically 2 (two) times daily. 45 g 1  . HYDROcodone-acetaminophen (NORCO) 5-325 MG per tablet Take 1 tablet by mouth daily as needed. Not to exceed 3 tabs/week To last 12 weeks 36 tablet 0  . ketoconazole (NIZORAL) 2 % cream Apply 1 application topically daily. Apply to affected area twice daily for 1 week, then as needed 30 g 0  . lisinopril-hydrochlorothiazide (PRINZIDE,ZESTORETIC) 20-12.5 MG per tablet TAKE 2 TABLETS BY MOUTH DAILY 180 tablet 1  . PARoxetine (PAXIL) 10 MG tablet TAKE 1 TABLET BY MOUTH DAILY 30 tablet 2   No current facility-administered medications for this visit.    Allergies:   Review of patient's allergies indicates no known allergies.   Past Medical History  Diagnosis Date  . Hypertension   . GERD (gastroesophageal reflux disease)   . Erectile dysfunction   . Anxiety   . Nicotine addiction   . Hyperlipidemia     no meds now  . Ulcer     Past Surgical History  Procedure Laterality Date  .  Polypectomy    . Colonoscopy    . Upper gastrointestinal endoscopy       Social History:  The patient  reports that he quit smoking about 2 years ago. His smoking use included Cigarettes. He has a 20 pack-year smoking history. He does not have any smokeless tobacco history on file. He reports that he drinks about 12.6 oz of alcohol per week. He reports that he does not use illicit drugs.   Family History:  The patient's family history includes Hypertension in his father and mother. There is no history of Colon cancer, Esophageal cancer, Rectal cancer, or Stomach cancer.    ROS:  Please see the history of present illness. All other systems are reviewed and  Negative to the above problem except as noted.    PHYSICAL EXAM: VS:  BP 142/82 mmHg  Pulse 58  Ht 5\' 10"  (1.778 m)  Wt 177 lb (80.287 kg)  BMI 25.40 kg/m2 \\  BP 140/ 80    GEN: Well nourished, well developed, in no acute distress HEENT: normal Neck: no JVD, carotid bruits, or masses Cardiac: RRR; no murmurs, rubs, or gallops,no edema  Respiratory:  clear to auscultation bilaterally, normal work of breathing GI: soft, nontender, nondistended, + BS  No  hepatomegaly  MS: no deformity Moving all extremities   Skin: warm and dry, no rash Neuro:  Strength and sensation are intact Psych: euthymic mood, full affect   EKG:  EKG is ordered today.  SB  58 bpm    Lipid Panel    Component Value Date/Time   CHOL 166 12/01/2014 0819   TRIG 67.0 12/01/2014 0819   HDL 53.90 12/01/2014 0819   CHOLHDL 3 12/01/2014 0819   VLDL 13.4 12/01/2014 0819   LDLCALC 99 12/01/2014 0819      Wt Readings from Last 3 Encounters:  03/19/15 177 lb (80.287 kg)  09/03/14 174 lb 6.4 oz (79.107 kg)  06/04/14 172 lb (78.019 kg)      ASSESSMENT AND PLAN:  1.  CAD  No symptms to suggest angina.  COntinue current regimen  2.  HL  WIll check lipids today  If LDL still not at goal could consider adding Zetia when it becomes generic  May be able  to lower lipids with less statin dosing  3.  HTN  Continue meds  Check BMET today.    F?U with Dr Moshe Cipro  I will be available in 1-1 1/2 years  He does have a signif copay and I think I can be available as needed once his numbers are in line and he is not having any symptoms     Orders Placed This Encounter  Procedures  . EKG 12-Lead     Signed, Dorris Carnes, MD  03/19/2015 8:52 AM    [facility] [address], [place], [place]  [address] Phone: [phone]

## 2015-03-20 LAB — CREATININE KINASE MB: CK, MB: <0.7 ng/mL (ref 0.0–5.0)

## 2015-03-24 ENCOUNTER — Encounter: Payer: Self-pay | Admitting: Family Medicine

## 2015-03-24 ENCOUNTER — Ambulatory Visit (INDEPENDENT_AMBULATORY_CARE_PROVIDER_SITE_OTHER): Payer: 59 | Admitting: Family Medicine

## 2015-03-24 VITALS — BP 104/80 | HR 89 | Resp 16 | Ht 70.0 in | Wt 179.0 lb

## 2015-03-24 DIAGNOSIS — F411 Generalized anxiety disorder: Secondary | ICD-10-CM

## 2015-03-24 DIAGNOSIS — R7301 Impaired fasting glucose: Secondary | ICD-10-CM

## 2015-03-24 DIAGNOSIS — I10 Essential (primary) hypertension: Secondary | ICD-10-CM | POA: Diagnosis not present

## 2015-03-24 DIAGNOSIS — Z139 Encounter for screening, unspecified: Secondary | ICD-10-CM

## 2015-03-24 DIAGNOSIS — M8949 Other hypertrophic osteoarthropathy, multiple sites: Secondary | ICD-10-CM

## 2015-03-24 DIAGNOSIS — E785 Hyperlipidemia, unspecified: Secondary | ICD-10-CM

## 2015-03-24 DIAGNOSIS — M159 Polyosteoarthritis, unspecified: Secondary | ICD-10-CM

## 2015-03-24 DIAGNOSIS — E663 Overweight: Secondary | ICD-10-CM

## 2015-03-24 DIAGNOSIS — F409 Phobic anxiety disorder, unspecified: Secondary | ICD-10-CM

## 2015-03-24 DIAGNOSIS — M150 Primary generalized (osteo)arthritis: Secondary | ICD-10-CM

## 2015-03-24 DIAGNOSIS — F5105 Insomnia due to other mental disorder: Secondary | ICD-10-CM

## 2015-03-24 MED ORDER — LISINOPRIL 20 MG PO TABS: 20.0000 mg | ORAL_TABLET | Freq: Every day | ORAL | Status: DC

## 2015-03-24 NOTE — Patient Instructions (Addendum)
Physical exam Dec 3 or after , call if you need me sooner  Congrats on excellent labs  Please work on good  health habits so that your health will improve. 1. Commitment to daily physical activity for 30 to 60  minutes, if you are able to do this.  2. Commitment to wise food choices. Aim for half of your  food intake to be vegetable and fruit, one quarter starchy foods, and one quarter protein. Try to eat on a regular schedule  3 meals per day, snacking between meals should be limited to vegetables or fruits or small portions of nuts. 64 ounces of water per day is generally recommended, unless you have specific health conditions, like heart failure or kidney failure where you will need to limit fluid intake.  3. Commitment to sufficient and a  good quality of physical and mental rest daily, generally between 6 to 8 hours per day.  WITH PERSISTANCE AND PERSEVERANCE, THE IMPOSSIBLE , BECOMES THE NORM!   Change in blood pressure med, since light headed at times and blood pressure is low, start lisinopril 20 mg one daily  Nurse BP check in 4 weeks  Let nurse know if you are still having excessive urination  Thanks for choosing [facility], we consider it a privelige to serve you.

## 2015-03-24 NOTE — Assessment & Plan Note (Addendum)
Overcorrected and symptomatic, change BP med by d/c HCTZ , nurse BP check in 4 weeks DASH diet and commitment to daily physical activity for a minimum of 30 minutes discussed and encouraged, as a part of hypertension management. The importance of attaining a healthy weight is also discussed.  BP/Weight 03/24/2015 03/19/2015 09/03/2014 06/04/2014 05/09/2014 03/11/2014 96/04/5915  Systolic BP 384 665 993 570 177 939 030  Diastolic BP 80 82 74 70 64 78 78  Wt. (Lbs) 179 177 174.4 172 165 168.8 176.08  BMI 25.68 25.4 25.02 24.68 23.68 24.22 25.26

## 2015-03-24 NOTE — Progress Notes (Signed)
   Eugene Acevedo     MRN: [id number]      DOB: 1955/10/07   HPI Mr. Crehan is here for follow up and re-evaluation of chronic medical conditions, medication management and review of any available recent lab and radiology data.  Preventive health is updated, specifically  Cancer screening and Immunization.   Questions or concerns regarding consultations or procedures which the PT has had in the interim are  addressed. The PT denies any adverse reactions to current medications since the last visit.  There are no new concerns.  There are no specific complaints   ROS Denies recent fever or chills. Denies sinus pressure, nasal congestion, ear pain or sore throat. Denies chest congestion, productive cough or wheezing. Denies chest pains, palpitations and leg swelling Denies abdominal pain, nausea, vomiting,diarrhea or constipation.   Denies dysuria, frequency, hesitancy or incontinence. C/o intermittent back and shoulder pain , depending on activity throughout the day Denies headaches, seizures, numbness, or tingling. Denies depression, uncontrolled  anxiety or insomnia.Relies on medication for help with anxiety and sleep, uses less beer , which is good Denies skin break down or rash.   PE  BP 120/80 mmHg  Pulse 89  Resp 16  Ht 5\' 10"  (1.778 m)  Wt 179 lb (81.194 kg)  BMI 25.68 kg/m2  SpO2 100%  Patient alert and oriented and in no cardiopulmonary distress.  HEENT: No facial asymmetry, EOMI,   oropharynx pink and moist.  Neck supple no JVD, no mass.  Chest: Clear to auscultation bilaterally.  CVS: S1, S2 no murmurs, no S3.Regular rate.  ABD: Soft non tender.   Ext: No edema  MS: Adequate ROM spine, shoulders, hips and knees.  Skin: Intact, no ulcerations or rash noted.  Psych: Good eye contact, normal affect. Memory intact not anxious or depressed appearing.  CNS: CN 2-12 intact, power,  normal throughout.no focal deficits noted.   Assessment & Plan   Essential  hypertension Overcorrected and symptomatic, change BP med by d/c HCTZ , nurse BP check in 4 weeks DASH diet and commitment to daily physical activity for a minimum of 30 minutes discussed and encouraged, as a part of hypertension management. The importance of attaining a healthy weight is also discussed.  BP/Weight 03/24/2015 03/19/2015 09/03/2014 06/04/2014 05/09/2014 03/11/2014 88/10/1029  Systolic BP 594 585 929 244 628 638 177  Diastolic BP 80 82 74 70 64 78 78  Wt. (Lbs) 179 177 174.4 172 165 168.8 176.08  BMI 25.68 25.4 25.02 24.68 23.68 24.22 25.26        Hyperlipidemia LDL goal <100 Hyperlipidemia:Low fat diet discussed and encouraged.   Lipid Panel  Lab Results  Component Value Date   CHOL 162 03/19/2015   HDL 64 03/19/2015   LDLCALC 84 03/19/2015   TRIG 68 03/19/2015   CHOLHDL 2.5 03/19/2015   Excellent, no med change     GAD (generalized anxiety disorder) Controlled, no change in medication   Insomnia due to anxiety and fear Sleep hygiene reviewed and written information offered also. Prescription sent for  medication needed.   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

## 2015-03-30 ENCOUNTER — Telehealth: Payer: Self-pay | Admitting: Family Medicine

## 2015-03-30 DIAGNOSIS — F409 Phobic anxiety disorder, unspecified: Secondary | ICD-10-CM | POA: Insufficient documentation

## 2015-03-30 DIAGNOSIS — F5105 Insomnia due to other mental disorder: Secondary | ICD-10-CM | POA: Insufficient documentation

## 2015-03-30 DIAGNOSIS — E663 Overweight: Secondary | ICD-10-CM | POA: Insufficient documentation

## 2015-03-30 NOTE — Assessment & Plan Note (Signed)
Sleep hygiene reviewed and written information offered also. Prescription sent for  medication needed.  

## 2015-03-30 NOTE — Assessment & Plan Note (Signed)
Pt is an [employer], and has intermittent pain flares affecting back and shoulders, judicious use of hydrocodone

## 2015-03-30 NOTE — Telephone Encounter (Signed)
Pls call pt and schedule nurse BP check in 4 to 6 weeks, I changed his med at last visit, and so he needs this, keep 12 visit with me please, thanks

## 2015-03-30 NOTE — Assessment & Plan Note (Addendum)
Hyperlipidemia:Low fat diet discussed and encouraged.   Lipid Panel  Lab Results  Component Value Date   CHOL 162 03/19/2015   HDL 64 03/19/2015   LDLCALC 84 03/19/2015   TRIG 68 03/19/2015   CHOLHDL 2.5 03/19/2015   Excellent, no med change

## 2015-03-30 NOTE — Assessment & Plan Note (Signed)
Controlled, no change in medication  

## 2015-04-28 ENCOUNTER — Ambulatory Visit: Payer: 59

## 2015-04-28 VITALS — BP 136/74 | Wt 170.1 lb

## 2015-04-28 DIAGNOSIS — I10 Essential (primary) hypertension: Secondary | ICD-10-CM

## 2015-04-28 NOTE — Progress Notes (Signed)
Patient in for blood pressure check.  Blood pressure over-corrected and meds changed at last visit.   Blood pressure within normal range.

## 2015-05-24 ENCOUNTER — Other Ambulatory Visit: Payer: Self-pay | Admitting: Family Medicine

## 2015-05-26 ENCOUNTER — Other Ambulatory Visit: Payer: Self-pay

## 2015-05-26 DIAGNOSIS — I10 Essential (primary) hypertension: Secondary | ICD-10-CM

## 2015-05-26 MED ORDER — ATORVASTATIN CALCIUM 40 MG PO TABS: 40.0000 mg | ORAL_TABLET | Freq: Every day | ORAL | Status: DC

## 2015-05-26 MED ORDER — LISINOPRIL 20 MG PO TABS: 20.0000 mg | ORAL_TABLET | Freq: Every day | ORAL | Status: DC

## 2015-05-26 MED ORDER — PAROXETINE HCL 10 MG PO TABS: 10.0000 mg | ORAL_TABLET | Freq: Every day | ORAL | Status: DC

## 2015-07-03 ENCOUNTER — Other Ambulatory Visit: Payer: Self-pay

## 2015-07-03 MED ORDER — HYDROCODONE-ACETAMINOPHEN 5-325 MG PO TABS: 1.0000 | ORAL_TABLET | Freq: Every day | ORAL | Status: DC | PRN

## 2015-09-04 ENCOUNTER — Other Ambulatory Visit: Payer: Self-pay | Admitting: Family Medicine

## 2015-09-07 ENCOUNTER — Other Ambulatory Visit: Payer: Self-pay

## 2015-09-07 MED ORDER — ALPRAZOLAM 1 MG PO TABS: 1.0000 mg | ORAL_TABLET | Freq: Every day | ORAL | Status: DC

## 2015-09-23 ENCOUNTER — Ambulatory Visit (INDEPENDENT_AMBULATORY_CARE_PROVIDER_SITE_OTHER): Payer: 59 | Admitting: Family Medicine

## 2015-09-23 ENCOUNTER — Encounter: Payer: Self-pay | Admitting: Family Medicine

## 2015-09-23 VITALS — BP 118/72 | HR 66 | Resp 16 | Ht 70.0 in | Wt 175.0 lb

## 2015-09-23 DIAGNOSIS — Z0000 Encounter for general adult medical examination without abnormal findings: Secondary | ICD-10-CM | POA: Diagnosis not present

## 2015-09-23 DIAGNOSIS — Z23 Encounter for immunization: Secondary | ICD-10-CM | POA: Diagnosis not present

## 2015-09-23 DIAGNOSIS — M5430 Sciatica, unspecified side: Secondary | ICD-10-CM | POA: Diagnosis not present

## 2015-09-23 DIAGNOSIS — Z1211 Encounter for screening for malignant neoplasm of colon: Secondary | ICD-10-CM

## 2015-09-23 DIAGNOSIS — M5440 Lumbago with sciatica, unspecified side: Secondary | ICD-10-CM | POA: Insufficient documentation

## 2015-09-23 DIAGNOSIS — R7301 Impaired fasting glucose: Secondary | ICD-10-CM

## 2015-09-23 DIAGNOSIS — M545 Low back pain: Secondary | ICD-10-CM | POA: Diagnosis not present

## 2015-09-23 DIAGNOSIS — Z125 Encounter for screening for malignant neoplasm of prostate: Secondary | ICD-10-CM

## 2015-09-23 DIAGNOSIS — E785 Hyperlipidemia, unspecified: Secondary | ICD-10-CM

## 2015-09-23 DIAGNOSIS — R918 Other nonspecific abnormal finding of lung field: Secondary | ICD-10-CM | POA: Diagnosis not present

## 2015-09-23 DIAGNOSIS — I10 Essential (primary) hypertension: Secondary | ICD-10-CM

## 2015-09-23 DIAGNOSIS — Z1159 Encounter for screening for other viral diseases: Secondary | ICD-10-CM

## 2015-09-23 LAB — POC HEMOCCULT BLD/STL (OFFICE/1-CARD/DIAGNOSTIC): FECAL OCCULT BLD: NEGATIVE

## 2015-09-23 MED ORDER — KETOROLAC TROMETHAMINE 60 MG/2ML IM SOLN
60.0000 mg | Freq: Once | INTRAMUSCULAR | Status: AC
  Administered 2015-09-23: 60 mg via INTRAMUSCULAR

## 2015-09-23 MED ORDER — PREDNISONE 5 MG PO TABS: 5.0000 mg | ORAL_TABLET | Freq: Two times a day (BID) | ORAL | Status: AC

## 2015-09-23 MED ORDER — METHYLPREDNISOLONE ACETATE 80 MG/ML IJ SUSP
80.0000 mg | Freq: Once | INTRAMUSCULAR | Status: AC
  Administered 2015-09-23: 80 mg via INTRAMUSCULAR

## 2015-09-23 NOTE — Assessment & Plan Note (Signed)
Uncontrolled.Toradol and depo medrol administered IM in the office , to be followed by a short course of oral prednisone and NSAIDS.  

## 2015-09-23 NOTE — Assessment & Plan Note (Signed)
Over 30 pack year history, quit x 2 years, has subpleural nodules which need to be re evaluated in January , will order

## 2015-09-23 NOTE — Assessment & Plan Note (Signed)
After obtaining informed consent, the vaccine is  administered by LPN.  

## 2015-09-23 NOTE — Assessment & Plan Note (Signed)

## 2015-09-23 NOTE — Progress Notes (Signed)
   Subjective:    Patient ID: Eugene Acevedo, male    DOB: November 15, 1955, 59 y.o.   MRN: [id number]  HPI Patient is in for annual physical exam. C/o increased back and lower extremity pain, requests injections today for this. Immunization is reviewed , and  updated .    Review of Systems    See HPI  Objective:   Physical Exam BP 118/72 mmHg  Pulse 66  Resp 16  Ht 5\' 10"  (1.778 m)  Wt 175 lb (79.379 kg)  BMI 25.11 kg/m2  SpO2 99% Pleasant well nourished male, alert and oriented x 3, in no cardio-pulmonary distress. Afebrile. HEENT No facial trauma or asymetry. Sinuses non tender. EOMI, PERTL, fundoscopic exam is negative for hemorhages or exudates. External ears normal, tympanic membranes clear. Oropharynx moist, no exudate,fairly  good dentition. Neck: supple, no adenopathy,JVD or thyromegaly.No bruits.  Chest: Clear to ascultation bilaterally.No crackles or wheezes. Non tender to palpation  Breast: No asymetry,no masses. No nipple discharge or inversion. No axillary or supraclavicular adenopathy  Cardiovascular system; Heart sounds normal,  S1 and  S2 ,no S3.  No murmur, or thrill. Apical beat not displaced Peripheral pulses normal.  Abdomen: Soft, non tender, no organomegaly or masses. No bruits. Bowel sounds normal. No guarding, tenderness or rebound.  Rectal:  Normal sphincter tone. No hemorrhoids or  masses. guaiac negative stool. Prostate smooth and firm  GU: Not examined.  Musculoskeletal exam: Decreased though adequate  ROM of lumbar spine, adequate in hips , shoulders and knees. No deformity ,swelling or crepitus noted. No muscle wasting or atrophy.   Neurologic: Cranial nerves 2 to 12 intact. Power, tone ,sensation and reflexes normal throughout. No disturbance in gait. No tremor.  Skin: Intact, no ulceration, erythema , scaling or rash noted. Pigmentation normal throughout  Psych; Normal mood and affect. Judgement and concentration  normal         Assessment & Plan:  Back pain of lumbar region with sciatica Uncontrolled.Toradol and depo medrol administered IM in the office , to be followed by a short course of oral prednisone and NSAIDS.   Annual physical exam Annual exam as documented. Counseling done  re healthy lifestyle involving commitment to 150 minutes exercise per week, heart healthy diet, and attaining healthy weight.The importance of adequate sleep also discussed. Regular seat belt use and home safety, is also discussed. Changes in health habits are decided on by the patient with goals and time frames  set for achieving them. Immunization and cancer screening needs are specifically addressed at this visit.   Multiple lung nodules Over 30 pack year history, quit x 2 years, has subpleural nodules which need to be re evaluated in January , will order  Need for prophylactic vaccination and inoculation against influenza After obtaining informed consent, the vaccine is  administered by LPN.

## 2015-09-23 NOTE — Patient Instructions (Addendum)
F/u in 6 months, call if you need me before  Flu vaccines today  Two injections toradol and depo medrol in the office for back o pain and 5 day course of prednisone also prescribed  You are referred for chest scan  Around mid  January, we will contact you with appt info have blood work fasting at that time please  Cut back on beer  Exam today is good  Thanks for choosing [facility], we consider it a privelige to serve you.  All the best for 2017!

## 2015-10-07 ENCOUNTER — Other Ambulatory Visit: Payer: Self-pay | Admitting: Family Medicine

## 2015-10-09 ENCOUNTER — Other Ambulatory Visit: Payer: Self-pay | Admitting: Family Medicine

## 2015-10-12 ENCOUNTER — Ambulatory Visit (HOSPITAL_COMMUNITY)
Admission: RE | Admit: 2015-10-12 | Discharge: 2015-10-12 | Disposition: A | Discharge status: Home / Self Care | Payer: BLUE CROSS/BLUE SHIELD | Source: Ambulatory Visit | Attending: Family Medicine | Admitting: Family Medicine

## 2015-10-12 DIAGNOSIS — R918 Other nonspecific abnormal finding of lung field: Secondary | ICD-10-CM | POA: Insufficient documentation

## 2015-10-12 DIAGNOSIS — F17200 Nicotine dependence, unspecified, uncomplicated: Secondary | ICD-10-CM | POA: Diagnosis not present

## 2015-10-12 LAB — HEMOGLOBIN A1C
Hgb A1c MFr Bld: 5.3 % (ref <5.7)
Mean Plasma Glucose: 105 mg/dL (ref <117)

## 2015-10-13 LAB — COMPREHENSIVE METABOLIC PANEL
ALBUMIN: 4.5 g/dL (ref 3.6–5.1)
ALK PHOS: 76 U/L (ref 40–115)
ALT: 14 U/L (ref 9–46)
AST: 13 U/L (ref 10–35)
BILIRUBIN TOTAL: 0.6 mg/dL (ref 0.2–1.2)
BUN: 11 mg/dL (ref 7–25)
CO2: 29 mmol/L (ref 20–31)
CREATININE: 0.81 mg/dL (ref 0.70–1.25)
Calcium: 9.6 mg/dL (ref 8.6–10.3)
Chloride: 104 mmol/L (ref 98–110)
Glucose, Bld: 99 mg/dL (ref 65–99)
Potassium: 4.5 mmol/L (ref 3.5–5.3)
SODIUM: 140 mmol/L (ref 135–146)
TOTAL PROTEIN: 7.2 g/dL (ref 6.1–8.1)

## 2015-10-13 LAB — PSA: PSA: 0.57 ng/mL (ref <=4.00)

## 2015-10-13 LAB — HEPATITIS C ANTIBODY: HCV Ab: NEGATIVE

## 2015-10-13 LAB — TSH: TSH: 1.259 u[IU]/mL (ref 0.350–4.500)

## 2015-10-16 ENCOUNTER — Telehealth: Payer: Self-pay | Admitting: Family Medicine

## 2015-10-16 NOTE — Telephone Encounter (Signed)
Patient is calling asking for lab results, please advise?

## 2015-10-16 NOTE — Telephone Encounter (Signed)
Called and left message notifying that a result letter has been mailed.

## 2015-11-06 ENCOUNTER — Other Ambulatory Visit: Payer: Self-pay | Admitting: Internal Medicine

## 2015-12-15 ENCOUNTER — Other Ambulatory Visit: Payer: Self-pay

## 2015-12-15 ENCOUNTER — Telehealth: Payer: Self-pay | Admitting: Family Medicine

## 2015-12-15 MED ORDER — SILDENAFIL CITRATE 100 MG PO TABS: 100.0000 mg | ORAL_TABLET | ORAL | Status: DC | PRN

## 2015-12-15 NOTE — Telephone Encounter (Signed)
Medication refilled

## 2015-12-15 NOTE — Telephone Encounter (Signed)
Patient is asking for a refill on Viagra to [facility] in [place]

## 2015-12-30 ENCOUNTER — Other Ambulatory Visit: Payer: Self-pay | Admitting: Family Medicine

## 2016-01-28 ENCOUNTER — Other Ambulatory Visit: Payer: Self-pay | Admitting: Family Medicine

## 2016-03-23 ENCOUNTER — Ambulatory Visit (INDEPENDENT_AMBULATORY_CARE_PROVIDER_SITE_OTHER): Payer: BLUE CROSS/BLUE SHIELD | Admitting: Family Medicine

## 2016-03-23 ENCOUNTER — Encounter: Payer: Self-pay | Admitting: Family Medicine

## 2016-03-23 VITALS — BP 118/80 | HR 75 | Resp 16 | Ht 70.0 in | Wt 171.1 lb

## 2016-03-23 DIAGNOSIS — M5440 Lumbago with sciatica, unspecified side: Secondary | ICD-10-CM

## 2016-03-23 DIAGNOSIS — Z23 Encounter for immunization: Secondary | ICD-10-CM

## 2016-03-23 DIAGNOSIS — E785 Hyperlipidemia, unspecified: Secondary | ICD-10-CM

## 2016-03-23 DIAGNOSIS — F5105 Insomnia due to other mental disorder: Secondary | ICD-10-CM

## 2016-03-23 DIAGNOSIS — L989 Disorder of the skin and subcutaneous tissue, unspecified: Secondary | ICD-10-CM | POA: Diagnosis not present

## 2016-03-23 DIAGNOSIS — F409 Phobic anxiety disorder, unspecified: Secondary | ICD-10-CM

## 2016-03-23 DIAGNOSIS — I10 Essential (primary) hypertension: Secondary | ICD-10-CM

## 2016-03-23 DIAGNOSIS — R918 Other nonspecific abnormal finding of lung field: Secondary | ICD-10-CM

## 2016-03-23 DIAGNOSIS — M545 Low back pain: Secondary | ICD-10-CM | POA: Diagnosis not present

## 2016-03-23 DIAGNOSIS — M5430 Sciatica, unspecified side: Secondary | ICD-10-CM

## 2016-03-23 LAB — CBC WITH DIFFERENTIAL/PLATELET
BASOS PCT: 1 %
Basophils Absolute: 68 cells/uL (ref 0–200)
EOS PCT: 4 %
Eosinophils Absolute: 272 cells/uL (ref 15–500)
HCT: 46.3 % (ref 38.5–50.0)
Hemoglobin: 16 g/dL (ref 13.2–17.1)
LYMPHS PCT: 29 %
Lymphs Abs: 1972 cells/uL (ref 850–3900)
MCH: 31 pg (ref 27.0–33.0)
MCHC: 34.6 g/dL (ref 32.0–36.0)
MCV: 89.7 fL (ref 80.0–100.0)
MONOS PCT: 7 %
MPV: 9.7 fL (ref 7.5–12.5)
Monocytes Absolute: 476 cells/uL (ref 200–950)
NEUTROS ABS: 4012 {cells}/uL (ref 1500–7800)
Neutrophils Relative %: 59 %
PLATELETS: 224 10*3/uL (ref 140–400)
RBC: 5.16 MIL/uL (ref 4.20–5.80)
RDW: 13.2 % (ref 11.0–15.0)
WBC: 6.8 10*3/uL (ref 3.8–10.8)

## 2016-03-23 LAB — COMPREHENSIVE METABOLIC PANEL
ALT: 13 U/L (ref 9–46)
AST: 17 U/L (ref 10–35)
Albumin: 4.4 g/dL (ref 3.6–5.1)
Alkaline Phosphatase: 80 U/L (ref 40–115)
BUN: 15 mg/dL (ref 7–25)
CHLORIDE: 105 mmol/L (ref 98–110)
CO2: 28 mmol/L (ref 20–31)
CREATININE: 0.83 mg/dL (ref 0.70–1.25)
Calcium: 9.4 mg/dL (ref 8.6–10.3)
GLUCOSE: 99 mg/dL (ref 65–99)
POTASSIUM: 4.5 mmol/L (ref 3.5–5.3)
SODIUM: 141 mmol/L (ref 135–146)
TOTAL PROTEIN: 7.1 g/dL (ref 6.1–8.1)
Total Bilirubin: 0.6 mg/dL (ref 0.2–1.2)

## 2016-03-23 LAB — LIPID PANEL
CHOL/HDL RATIO: 2.9 ratio (ref <=5.0)
Cholesterol: 174 mg/dL (ref 125–200)
HDL: 60 mg/dL (ref >=40)
LDL Cholesterol: 103 mg/dL (ref <130)
Triglycerides: 57 mg/dL (ref <150)
VLDL: 11 mg/dL (ref <30)

## 2016-03-23 MED ORDER — HYDROCODONE-ACETAMINOPHEN 5-325 MG PO TABS: 1.0000 | ORAL_TABLET | Freq: Every day | ORAL | Status: DC | PRN

## 2016-03-23 NOTE — Assessment & Plan Note (Signed)
Hyperlipidemia:Low fat diet discussed and encouraged.   Lipid Panel  Lab Results  Component Value Date   CHOL 162 03/19/2015   HDL 64 03/19/2015   LDLCALC 84 03/19/2015   TRIG 68 03/19/2015   CHOLHDL 2.5 03/19/2015   Updated lab needed at/ before next visit.

## 2016-03-23 NOTE — Patient Instructions (Addendum)
Annual exam December , call if you need me sooner  Labs today, CBC, lipid and cmp  Doing well.  Medication as listed  Shingles vaccine today  Pain contract revised and renewed today, script provided also. DO NOT TAKE MORE THAN THREE TABLETS PER WEEK  Continue to refrain from smoking and cut back on beer  You are referred to dermatologist  Use sun screen daily on face and body due to excessive sun exposure  Thank you  for choosing [facility]. We consider it a privelige to serve you.  Delivering excellent health care in a caring and  compassionate way is our goal.  Partnering with you,  so that together we can achieve this goal is our strategy.

## 2016-03-23 NOTE — Assessment & Plan Note (Signed)
Controlled, no change in medication Sleep hygiene reviewed and written information offered also. Prescription sent for  medication needed.  

## 2016-03-23 NOTE — Assessment & Plan Note (Signed)
Controlled, no change in medication DASH diet and commitment to daily physical activity for a minimum of 30 minutes discussed and encouraged, as a part of hypertension management. The importance of attaining a healthy weight is also discussed.  BP/Weight 03/23/2016 09/23/2015 04/28/2015 03/24/2015 03/19/2015 [id number]  Systolic BP 123456 [id number] [id number] 123456 [id number] 123456 123456  Diastolic BP 80 72 74 80 82 74 70  Wt. (Lbs) 171.12 175 170.08 179 177 174.4 172  BMI 24.55 25.11 24.4 25.68 25.4 25.02 24.68

## 2016-03-23 NOTE — Assessment & Plan Note (Addendum)
Controlled, no change in medication, 3 tablets per week on average needed

## 2016-03-23 NOTE — Progress Notes (Signed)
   Eugene Acevedo     MRN: [id number]      DOB: 1955/11/20   HPI Eugene Acevedo is here for follow up and re-evaluation of chronic medical conditions, medication management and review of any available recent lab and radiology data.  Preventive health is updated, specifically  Cancer screening and Immunization.   Questions or concerns regarding consultations or procedures which the PT has had in the interim are  addressed. The PT denies any adverse reactions to current medications since the last visit.  C/o itchy skin lesion on left forehead, which is recurrent and dark skin lesion  On right abdomen, has seen derm in the past, a lot of sun exposure, wants to return  ROS Denies recent fever or chills. Denies sinus pressure, nasal congestion, ear pain or sore throat. Denies chest congestion, productive cough or wheezing. Denies chest pains, palpitations and leg swelling Denies abdominal pain, nausea, vomiting,diarrhea or constipation.   Denies dysuria, frequency, hesitancy or incontinence. Denies uncontrolled joint pain, swelling and limitation in mobility. Denies headaches, seizures, numbness, or tingling. Denies depression, anxiety or insomnia.  PE  BP 118/80 mmHg  Pulse 75  Resp 16  Ht 5\' 10"  (1.778 m)  Wt 171 lb 1.9 oz (77.62 kg)  BMI 24.55 kg/m2  SpO2 99%  Patient alert and oriented and in no cardiopulmonary distress.  HEENT: No facial asymmetry, EOMI,   oropharynx pink and moist.  Neck supple no JVD, no mass.  Chest: Clear to auscultation bilaterally.  CVS: S1, S2 no murmurs, no S3.Regular rate.  ABD: Soft non tender.   Ext: No edema  MS: Adequate ROM spine, shoulders, hips and knees.  Skin: Intact, no ulcerations or rash noted.  Psych: Good eye contact, normal affect. Memory intact not anxious or depressed appearing.  CNS: CN 2-12 intact, power,  normal throughout.no focal deficits noted.   Assessment & Plan   Essential hypertension Controlled, no change in  medication DASH diet and commitment to daily physical activity for a minimum of 30 minutes discussed and encouraged, as a part of hypertension management. The importance of attaining a healthy weight is also discussed.  BP/Weight 03/23/2016 09/23/2015 04/28/2015 03/24/2015 03/19/2015 [id number]  Systolic BP 123456 123456 [id number] 123456 [id number] 123456 123456  Diastolic BP 80 72 74 80 82 74 70  Wt. (Lbs) 171.12 175 170.08 179 177 174.4 172  BMI 24.55 25.11 24.4 25.68 25.4 25.02 24.68        Back pain of lumbar region with sciatica Controlled, no change in medication, 3 tablets per week on average needed   Insomnia due to anxiety and fear Controlled, no change in medication Sleep hygiene reviewed and written information offered also. Prescription sent for  medication needed.   Hyperlipidemia LDL goal <100 Hyperlipidemia:Low fat diet discussed and encouraged.   Lipid Panel  Lab Results  Component Value Date   CHOL 162 03/19/2015   HDL 64 03/19/2015   LDLCALC 84 03/19/2015   TRIG 68 03/19/2015   CHOLHDL 2.5 03/19/2015   Updated lab needed at/ before next visit.      Skin lesions Rough area on left forehead x 6 month and hyperpigmented lesion on abdomen irregular x 1 year refer to derm

## 2016-03-23 NOTE — Assessment & Plan Note (Signed)
Rough area on left forehead x 6 month and hyperpigmented lesion on abdomen irregular x 1 year refer to derm

## 2016-03-23 NOTE — Assessment & Plan Note (Signed)
After obtaining informed consent, the vaccine is  administered by LPN.  

## 2016-03-30 ENCOUNTER — Telehealth: Payer: Self-pay | Admitting: Family Medicine

## 2016-03-30 NOTE — Telephone Encounter (Signed)
Patient aware of results.

## 2016-03-30 NOTE — Telephone Encounter (Signed)
Patient is calling for lab results, please advise. 

## 2016-04-23 ENCOUNTER — Other Ambulatory Visit: Payer: Self-pay | Admitting: Family Medicine

## 2016-05-03 ENCOUNTER — Other Ambulatory Visit: Payer: Self-pay | Admitting: Family Medicine

## 2016-08-11 ENCOUNTER — Encounter: Payer: Self-pay | Admitting: Family Medicine

## 2016-08-11 ENCOUNTER — Ambulatory Visit (INDEPENDENT_AMBULATORY_CARE_PROVIDER_SITE_OTHER): Payer: BLUE CROSS/BLUE SHIELD | Admitting: Family Medicine

## 2016-08-11 VITALS — BP 110/64 | HR 84 | Temp 98.1°F | Resp 16 | Ht 70.0 in | Wt 175.1 lb

## 2016-08-11 DIAGNOSIS — L0231 Cutaneous abscess of buttock: Secondary | ICD-10-CM | POA: Diagnosis not present

## 2016-08-11 MED ORDER — HYDROCODONE-ACETAMINOPHEN 5-325 MG PO TABS: 1.0000 | ORAL_TABLET | Freq: Every day | ORAL | 0 refills | Status: DC | PRN

## 2016-08-11 NOTE — Progress Notes (Signed)
    Chief Complaint  Patient presents with  . Recurrent Skin Infections   Here for abscess on backside Present for a week and getting worse Hurts to sit No fever or malaise or suspected systemic symptoms No diabetes.  Patient Active Problem List   Diagnosis Date Noted  . Skin lesions 03/23/2016  . Need for Zostavax administration 03/23/2016  . Back pain of lumbar region with sciatica 09/23/2015  . Multiple lung nodules 09/23/2015  . Insomnia due to anxiety and fear 03/30/2015  . ED (erectile dysfunction) 09/10/2012  . Osteoarthritis 07/25/2011  . Hyperlipidemia LDL goal <100 12/17/2007  . GAD (generalized anxiety disorder) 12/17/2007  . Essential hypertension 12/17/2007  . GERD 12/17/2007    Outpatient Encounter Prescriptions as of 08/11/2016  Medication Sig  . ALPRAZolam (XANAX) 1 MG tablet TAKE 1 TABLET BY MOUTH AT BEDTIME  . aspirin EC 81 MG tablet Take 1 tablet (81 mg total) by mouth daily.  Marland Kitchen atorvastatin (LIPITOR) 40 MG tablet TAKE 1 TABLET BY MOUTH EVERY DAY  . HYDROcodone-acetaminophen (NORCO) 5-325 MG tablet Take 1 tablet by mouth daily as needed. Not to exceed 3 tabs/week To last 12 weeks  . lisinopril (PRINIVIL,ZESTRIL) 20 MG tablet TAKE 1 TABLET BY MOUTH EVERY DAY  . sildenafil (VIAGRA) 100 MG tablet Take 1 tablet (100 mg total) by mouth as needed for erectile dysfunction.  . tamsulosin (FLOMAX) 0.4 MG CAPS capsule TK ONE C PO  IN THE EVENING  . [DISCONTINUED] HYDROcodone-acetaminophen (NORCO) 5-325 MG tablet Take 1 tablet by mouth daily as needed. Not to exceed 3 tabs/week To last 12 weeks   No facility-administered encounter medications on file as of 08/11/2016.    No Known Allergies  Review of Systems  Constitutional: Negative for activity change, appetite change, chills and fever.  Gastrointestinal: Negative for blood in stool, constipation and diarrhea.  Musculoskeletal: Negative for myalgias.  Skin: Positive for color change and wound.    Psychiatric/Behavioral: Negative for sleep disturbance. The patient is not nervous/anxious.   All other systems reviewed and are negative.   BP 110/64 (BP Location: Left Arm, Patient Position: Sitting, Cuff Size: Large)   Pulse 84   Temp 98.1 F (36.7 C) (Oral)   Resp 16   Ht 5\' 10"  (1.778 m)   Wt 175 lb 1.3 oz (79.4 kg)   SpO2 98%   BMI 25.12 kg/m   Physical Exam  Constitutional: He appears well-developed and well-nourished. No distress.  HENT:  Head: Normocephalic and atraumatic.  Mouth/Throat: Oropharynx is clear and moist.  Cardiovascular: Normal rate, regular rhythm and normal heart sounds.   Pulmonary/Chest: Effort normal and breath sounds normal.  Musculoskeletal:       Legs: Skin: Skin is warm and dry.  Abscess visible right buttock.  Erythema 3 cm.  Palpable nodule is 5cm across  Psychiatric: He has a normal mood and affect. His behavior is normal. Thought content normal.    ASSESSMENT/PLAN:  Abscess of right buttock - Plan: Incision and drainage  TIME OUT Consent Area cleansed with betadine Skin overlying Abscess was anesthestized with 2 cc of 1% lidocaine Stab incision with 11 blade Loculations broken up with forceps Small amount purulence obtained No complications Bandage placed Patient tolerated well   Patient Instructions  May use warm compresses Leave gauze drain in place a couple of days if you can Need bandage only until drainage stops This should gradually resolve Call or come back for any problems   Raylene Everts, MD

## 2016-08-11 NOTE — Patient Instructions (Addendum)
May use warm compresses Leave gauze drain in place a couple of days if you can Need bandage only until drainage stops This should gradually resolve Call or come back for any problems

## 2016-08-11 NOTE — Progress Notes (Signed)
2 cc 1% lidocaine used for procedure.

## 2016-09-07 ENCOUNTER — Ambulatory Visit (INDEPENDENT_AMBULATORY_CARE_PROVIDER_SITE_OTHER): Payer: BLUE CROSS/BLUE SHIELD

## 2016-09-07 ENCOUNTER — Encounter: Payer: Self-pay | Admitting: Family Medicine

## 2016-09-07 DIAGNOSIS — Z23 Encounter for immunization: Secondary | ICD-10-CM | POA: Diagnosis not present

## 2016-09-08 ENCOUNTER — Telehealth: Payer: Self-pay

## 2016-09-08 DIAGNOSIS — E785 Hyperlipidemia, unspecified: Secondary | ICD-10-CM

## 2016-09-08 DIAGNOSIS — Z125 Encounter for screening for malignant neoplasm of prostate: Secondary | ICD-10-CM

## 2016-09-08 DIAGNOSIS — I10 Essential (primary) hypertension: Secondary | ICD-10-CM

## 2016-09-08 NOTE — Telephone Encounter (Signed)
Orders printed and mailed to patient to have drawn in Jan

## 2016-09-21 ENCOUNTER — Other Ambulatory Visit: Payer: Self-pay | Admitting: Family Medicine

## 2016-10-26 ENCOUNTER — Ambulatory Visit (INDEPENDENT_AMBULATORY_CARE_PROVIDER_SITE_OTHER): Payer: BLUE CROSS/BLUE SHIELD | Admitting: Family Medicine

## 2016-10-26 ENCOUNTER — Encounter: Payer: Self-pay | Admitting: Family Medicine

## 2016-10-26 VITALS — BP 108/72 | HR 78 | Resp 16 | Ht 72.0 in | Wt 173.4 lb

## 2016-10-26 DIAGNOSIS — R918 Other nonspecific abnormal finding of lung field: Secondary | ICD-10-CM

## 2016-10-26 DIAGNOSIS — Z87891 Personal history of nicotine dependence: Secondary | ICD-10-CM

## 2016-10-26 DIAGNOSIS — M5440 Lumbago with sciatica, unspecified side: Secondary | ICD-10-CM | POA: Diagnosis not present

## 2016-10-26 DIAGNOSIS — Z0000 Encounter for general adult medical examination without abnormal findings: Secondary | ICD-10-CM

## 2016-10-26 DIAGNOSIS — E785 Hyperlipidemia, unspecified: Secondary | ICD-10-CM

## 2016-10-26 DIAGNOSIS — Z1211 Encounter for screening for malignant neoplasm of colon: Secondary | ICD-10-CM | POA: Diagnosis not present

## 2016-10-26 LAB — POC HEMOCCULT BLD/STL (OFFICE/1-CARD/DIAGNOSTIC): Fecal Occult Blood, POC: NEGATIVE

## 2016-10-26 LAB — LIPID PANEL
CHOLESTEROL: 236 mg/dL — AB (ref <200)
HDL: 57 mg/dL (ref >40)
LDL CALC: 155 mg/dL — AB (ref <100)
Total CHOL/HDL Ratio: 4.1 Ratio (ref <5.0)
Triglycerides: 118 mg/dL (ref <150)
VLDL: 24 mg/dL (ref <30)

## 2016-10-26 LAB — COMPREHENSIVE METABOLIC PANEL
ALT: 14 U/L (ref 9–46)
AST: 16 U/L (ref 10–35)
Albumin: 4.3 g/dL (ref 3.6–5.1)
Alkaline Phosphatase: 81 U/L (ref 40–115)
BUN: 15 mg/dL (ref 7–25)
CHLORIDE: 102 mmol/L (ref 98–110)
CO2: 26 mmol/L (ref 20–31)
CREATININE: 0.94 mg/dL (ref 0.70–1.25)
Calcium: 9.3 mg/dL (ref 8.6–10.3)
GLUCOSE: 104 mg/dL — AB (ref 65–99)
Potassium: 4.6 mmol/L (ref 3.5–5.3)
SODIUM: 138 mmol/L (ref 135–146)
Total Bilirubin: 0.5 mg/dL (ref 0.2–1.2)
Total Protein: 7.1 g/dL (ref 6.1–8.1)

## 2016-10-26 LAB — TSH: TSH: 1.37 m[IU]/L (ref 0.40–4.50)

## 2016-10-26 LAB — PSA: PSA: 0.5 ng/mL (ref <=4.0)

## 2016-10-26 MED ORDER — TRAMADOL HCL 50 MG PO TABS: ORAL_TABLET | ORAL | 1 refills | Status: DC

## 2016-10-26 NOTE — Assessment & Plan Note (Addendum)
Change in pain management to tramadol, as needed, expected to use 2 to 4 tablets per week. Encouraged stretching , back strengthening exercise and use of topical rubs for chronic pain as well as tylenol. Discontinued hydrocodone, stated he used on avg 2 per week, no contract signed.

## 2016-10-26 NOTE — Progress Notes (Signed)
   Eugene Acevedo     MRN: [id number]      DOB: 09-Oct-1955   HPI: Patient is in for annual physical exam. Had recurrent boil on buttock,  Believes flomax is the cause and has stopped taking it, just has cranberries instead, and denies significant outflow obstrucion symptoms, and urinates twice each night. Educated re the fact that the tablet cannot cause infection, and not treating his enlarged prostate, so since he is only mildy symptomatic, no need to replace or resume at this time. Chronic pain management reviewed. He honestly only takes on avh hydrocodone 2 per week, will be changed to tramadol and is advised to use tylenol, muscle rubs and do stretching exercises regularly.  Immunization is reviewed , and  updated if needed.    PE; BP 108/72   Pulse 78   Resp 16   Ht 6' (1.829 m)   Wt 173 lb 6.4 oz (78.7 kg)   SpO2 98%   BMI 23.52 kg/m   Pleasant male, alert and oriented x 3, in no cardio-pulmonary distress. Afebrile. HEENT No facial trauma or asymetry. Sinuses non tender. EOMI, . External ears normal, . Oropharynx moist, no exudate. Neck: supple, no adenopathy,JVD or thyromegaly.No bruits.  Chest: Clear to ascultation bilaterally.No crackles or wheezes. Non tender to palpation  Breast: No asymetry,no masses. No nipple discharge or inversion. No axillary or supraclavicular adenopathy  Cardiovascular system; Heart sounds normal,  S1 and  S2 ,no S3.  No murmur, or thrill. Apical beat not displaced Peripheral pulses normal.  Abdomen: Soft, non tender, no organomegaly or masses. No bruits. Bowel sounds normal. No guarding, tenderness or rebound.  Rectal:  Normal sphincter tone. No hemorrhoids or  masses. guaiac negative stool. Prostate enlarged. smooth and firm    Musculoskeletal exam: Full ROM of spine, hips , shoulders and knees. No deformity ,swelling or crepitus noted. No muscle wasting or atrophy.   Neurologic: Cranial nerves 2 to 12  intact. Power, tone ,sensation and reflexes normal throughout. No disturbance in gait. No tremor.  Skin: Intact, no ulceration, erythema , scaling or rash noted. Pigmentation normal throughout  Psych; Normal mood and affect. Judgement and concentration normal   Assessment & Plan:  Annual physical exam Annual exam as documented. Counseling done  re healthy lifestyle involving commitment to 150 minutes exercise per week, heart healthy diet, and attaining healthy weight.The importance of adequate sleep also discussed. Regular seat belt use and home safety, is also discussed. Changes in health habits are decided on by the patient with goals and time frames  set for achieving them. Immunization and cancer screening needs are specifically addressed at this visit.   Back pain of lumbar region with sciatica Change in pain management to tramadol, as needed, expected to use 2 to 4 tablets per week. Encouraged stretching , back strengthening exercise and use of topical rubs for chronic pain as well as tylenol. Discontinued hydrocodone, stated he used on avg 2 per week, no contract signed.  Hyperlipidemia LDL goal <100 Pt stopped statin updated lab today  History of smoking 30 or more pack years Started smoking at age 61, stopped at age 61. Reports smoking 1 PPD for over 30 years during this time period, needs annual low dose chest screening. Encouraged to continue to remain nicotine free

## 2016-10-26 NOTE — Patient Instructions (Signed)
F/u in 6 month, call if you need me sooner  Labs today 9print previos order in December)  You will be referred for chest scan to screen for lung cancer   Please work on good  health habits so that your health will improve. 1. Commitment to daily physical activity for 30 to 60  minutes, if you are able to do this.  2. Commitment to wise food choices. Aim for half of your  food intake to be vegetable and fruit, one quarter starchy foods, and one quarter protein. Try to eat on a regular schedule  3 meals per day, snacking between meals should be limited to vegetables or fruits or small portions of nuts. 64 ounces of water per day is generally recommended, unless you have specific health conditions, like heart failure or kidney failure where you will need to limit fluid intake.  3. Commitment to sufficient and a  good quality of physical and mental rest daily, generally between 6 to 8 hours per day.  WITH PERSISTANCE AND PERSEVERANCE, THE IMPOSSIBLE , BECOMES THE NORM! Thank you  for choosing [facility]. We consider it a privelige to serve you.  Delivering excellent health care in a caring and  compassionate way is our goal.  Partnering with you,  so that together we can achieve this goal is our strategy.

## 2016-10-26 NOTE — Assessment & Plan Note (Signed)
Pt stopped statin updated lab today

## 2016-10-26 NOTE — Assessment & Plan Note (Addendum)
Started smoking at age 61, stopped at age 82. Reports smoking 1 PPD for over 30 years during this time period, needs annual low dose chest screening. Encouraged to continue to remain nicotine free

## 2016-10-26 NOTE — Assessment & Plan Note (Signed)

## 2016-10-27 ENCOUNTER — Other Ambulatory Visit: Payer: Self-pay | Admitting: Family Medicine

## 2016-11-02 ENCOUNTER — Ambulatory Visit (HOSPITAL_COMMUNITY)
Admission: RE | Admit: 2016-11-02 | Discharge: 2016-11-02 | Disposition: A | Discharge status: Home / Self Care | Payer: BLUE CROSS/BLUE SHIELD | Source: Ambulatory Visit | Attending: Family Medicine | Admitting: Family Medicine

## 2016-11-02 DIAGNOSIS — I2510 Atherosclerotic heart disease of native coronary artery without angina pectoris: Secondary | ICD-10-CM | POA: Insufficient documentation

## 2016-11-02 DIAGNOSIS — J439 Emphysema, unspecified: Secondary | ICD-10-CM | POA: Insufficient documentation

## 2016-11-02 DIAGNOSIS — Z87891 Personal history of nicotine dependence: Secondary | ICD-10-CM | POA: Diagnosis present

## 2016-11-02 DIAGNOSIS — I700 Atherosclerosis of aorta: Secondary | ICD-10-CM | POA: Insufficient documentation

## 2016-11-03 ENCOUNTER — Telehealth: Payer: Self-pay

## 2016-11-03 DIAGNOSIS — E785 Hyperlipidemia, unspecified: Secondary | ICD-10-CM

## 2016-11-03 DIAGNOSIS — I10 Essential (primary) hypertension: Secondary | ICD-10-CM

## 2016-11-03 DIAGNOSIS — R7301 Impaired fasting glucose: Secondary | ICD-10-CM

## 2016-11-03 NOTE — Telephone Encounter (Signed)
-----   Message from Fayrene Helper, MD sent at 10/27/2016  5:03 AM EST ----- Pls let him know prostate blood test is normal Blood sugar above normal , needs to reduce sweets and eat more vegetable, no sweet drinks/ sodas Cholesterol increased , give numbers. ReDuce fat in diet and resume med he was on before .( lipitor 40 mg entered pls send) Needs fasting lipid, cmp and HBA1C  For next visit, pls order when appropriate

## 2016-11-03 NOTE — Telephone Encounter (Signed)
Patient aware of recent results.  Repeat labs ordered and mailed to patient.

## 2016-11-22 ENCOUNTER — Other Ambulatory Visit: Payer: Self-pay | Admitting: Family Medicine

## 2016-11-23 NOTE — Progress Notes (Signed)
Flu shot

## 2016-11-25 ENCOUNTER — Encounter: Payer: Self-pay | Admitting: Internal Medicine

## 2016-11-25 ENCOUNTER — Other Ambulatory Visit: Payer: Self-pay | Admitting: Family Medicine

## 2016-11-29 ENCOUNTER — Other Ambulatory Visit: Payer: Self-pay | Admitting: Family Medicine

## 2016-12-14 ENCOUNTER — Encounter: Payer: Self-pay | Admitting: Internal Medicine

## 2017-02-16 ENCOUNTER — Encounter: Payer: Self-pay | Admitting: Internal Medicine

## 2017-02-16 ENCOUNTER — Ambulatory Visit (AMBULATORY_SURGERY_CENTER): Payer: Self-pay | Admitting: *Deleted

## 2017-02-16 VITALS — Ht 70.0 in | Wt 165.0 lb

## 2017-02-16 DIAGNOSIS — Z86010 Personal history of colonic polyps: Secondary | ICD-10-CM

## 2017-02-16 MED ORDER — NA SULFATE-K SULFATE-MG SULF 17.5-3.13-1.6 GM/177ML PO SOLN: 1.0000 | Freq: Once | ORAL | 0 refills | Status: AC

## 2017-02-16 NOTE — Progress Notes (Signed)
No egg or soy allergy known to patient  No issues with past sedation with any surgeries  or procedures, no past intubation No diet pills per patient No home 02 use per patient  No blood thinners per patient  Pt denies issues with constipation  No A fib or A flutter  EMMI video sent to pt's e mail  Pay no more than 50 coupon to patient for suprep

## 2017-02-28 ENCOUNTER — Telehealth: Payer: Self-pay | Admitting: Internal Medicine

## 2017-02-28 NOTE — Telephone Encounter (Signed)
Spoke with pharmacist at length regarding the coupon.  He stated that it was a software problem.  I gave him another coupon number etc, and it worked. I called the patient and he's happy. He will pick up the prep today.

## 2017-03-02 ENCOUNTER — Ambulatory Visit (AMBULATORY_SURGERY_CENTER): Payer: BLUE CROSS/BLUE SHIELD | Admitting: Internal Medicine

## 2017-03-02 ENCOUNTER — Encounter: Payer: Self-pay | Admitting: Internal Medicine

## 2017-03-02 VITALS — BP 103/55 | HR 58 | Temp 97.5°F | Resp 12 | Ht 70.0 in | Wt 165.0 lb

## 2017-03-02 DIAGNOSIS — Z86010 Personal history of colonic polyps: Secondary | ICD-10-CM

## 2017-03-02 HISTORY — PX: COLONOSCOPY: SHX174

## 2017-03-02 MED ORDER — SODIUM CHLORIDE 0.9 % IV SOLN: 500.0000 mL | INTRAVENOUS | Status: DC

## 2017-03-02 NOTE — Op Note (Signed)
[place] Patient Name: Jamichael Knotts Procedure Date: 03/02/2017 11:22 AM MRN: [id number] Endoscopist: Docia Chuck. Henrene Pastor , MD Age: 61 Referring MD:  Date of Birth: 04/05/1956 Gender: Male Account #: [id number] Procedure:                Colonoscopy Indications:              High risk colon cancer surveillance: Personal                            history of multiple (3 or more) adenomas. Prior                            examinations 2008 and 2013 Medicines:                Monitored Anesthesia Care Procedure:                Pre-Anesthesia Assessment:                           - Prior to the procedure, a History and Physical                            was performed, and patient medications and                            allergies were reviewed. The patient's tolerance of                            previous anesthesia was also reviewed. The risks                            and benefits of the procedure and the sedation                            options and risks were discussed with the patient.                            All questions were answered, and informed consent                            was obtained. Prior Anticoagulants: The patient has                            taken no previous anticoagulant or antiplatelet                            agents. ASA Grade Assessment: II - A patient with                            mild systemic disease. After reviewing the risks                            and benefits, the patient was deemed in  satisfactory condition to undergo the procedure.                           After obtaining informed consent, the colonoscope                            was passed under direct vision. Throughout the                            procedure, the patient's blood pressure, pulse, and                            oxygen saturations were monitored continuously. The                            Colonoscope was introduced through the  anus and                            advanced to the the cecum, identified by                            appendiceal orifice and ileocecal valve. The                            ileocecal valve, appendiceal orifice, and rectum                            were photographed. The quality of the bowel                            preparation was good. The colonoscopy was performed                            without difficulty. The patient tolerated the                            procedure well. The bowel preparation used was                            SUPREP. Scope In: 11:33:31 AM Scope Out: 11:48:09 AM Scope Withdrawal Time: 0 hours 12 minutes 52 seconds  Total Procedure Duration: 0 hours 14 minutes 38 seconds  Findings:                 Multiple diverticula were found in the sigmoid                            colon.                           Internal hemorrhoids were found during                            retroflexion. The hemorrhoids were small.  The exam was otherwise without abnormality on                            direct and retroflexion views. Complications:            No immediate complications. Estimated blood loss:                            None. Estimated Blood Loss:     Estimated blood loss: none. Impression:               - Diverticulosis in the sigmoid colon.                           - Internal hemorrhoids.                           - The examination was otherwise normal on direct                            and retroflexion views.                           - No specimens collected. Recommendation:           - Repeat colonoscopy in 5 years for surveillance.                           - Patient has a contact number available for                            emergencies. The signs and symptoms of potential                            delayed complications were discussed with the                            patient. Return to normal activities tomorrow.                             Written discharge instructions were provided to the                            patient.                           - Resume previous diet.                           - Continue present medications. Docia Chuck. Henrene Pastor, MD 03/02/2017 11:52:21 AM This report has been signed electronically.

## 2017-03-02 NOTE — Progress Notes (Signed)
Report to PACU, RN, vss, BBS= Clear.  

## 2017-03-02 NOTE — Patient Instructions (Signed)
Impression/Recommendations:  Diverticulosis handout given to patient. Hemorrhoid handout given to patient.  Repeat colonoscopy in 5 years for surveillance.  Resume previous diet. Continue present medications.  YOU HAD AN ENDOSCOPIC PROCEDURE TODAY AT THE [facility]:   Refer to the procedure report that was given to you for any specific questions about what was found during the examination.  If the procedure report does not answer your questions, please call your gastroenterologist to clarify.  If you requested that your care partner not be given the details of your procedure findings, then the procedure report has been included in a sealed envelope for you to review at your convenience later.  YOU SHOULD EXPECT: Some feelings of bloating in the abdomen. Passage of more gas than usual.  Walking can help get rid of the air that was put into your GI tract during the procedure and reduce the bloating. If you had a lower endoscopy (such as a colonoscopy or flexible sigmoidoscopy) you may notice spotting of blood in your stool or on the toilet paper. If you underwent a bowel prep for your procedure, you may not have a normal bowel movement for a few days.  Please Note:  You might notice some irritation and congestion in your nose or some drainage.  This is from the oxygen used during your procedure.  There is no need for concern and it should clear up in a day or so.  SYMPTOMS TO REPORT IMMEDIATELY:   Following lower endoscopy (colonoscopy or flexible sigmoidoscopy):  Excessive amounts of blood in the stool  Significant tenderness or worsening of abdominal pains  Swelling of the abdomen that is new, acute  Fever of 100F or higher  For urgent or emergent issues, a gastroenterologist can be reached at any hour by calling [phone].   DIET:  We do recommend a small meal at first, but then you may proceed to your regular diet.  Drink plenty of fluids but you should avoid  alcoholic beverages for 24 hours.  ACTIVITY:  You should plan to take it easy for the rest of today and you should NOT DRIVE or use heavy machinery until tomorrow (because of the sedation medicines used during the test).    FOLLOW UP: Our staff will call the number listed on your records the next business day following your procedure to check on you and address any questions or concerns that you may have regarding the information given to you following your procedure. If we do not reach you, we will leave a message.  However, if you are feeling well and you are not experiencing any problems, there is no need to return our call.  We will assume that you have returned to your regular daily activities without incident.  If any biopsies were taken you will be contacted by phone or by letter within the next 1-3 weeks.  Please call us at [phone] if you have not heard about the biopsies in 3 weeks.    SIGNATURES/CONFIDENTIALITY: You and/or your care partner have signed paperwork which will be entered into your electronic medical record.  These signatures attest to the fact that that the information above on your After Visit Summary has been reviewed and is understood.  Full responsibility of the confidentiality of this discharge information lies with you and/or your care-partner. 

## 2017-03-03 ENCOUNTER — Telehealth: Payer: Self-pay

## 2017-03-03 NOTE — Telephone Encounter (Signed)
  Follow up Call-  Call back number 03/02/2017  Post procedure Call Back phone  # [phone]  Permission to leave phone message Yes  Some recent data might be hidden     Patient questions:  Do you have a fever, pain , or abdominal swelling? No. Pain Score  0 *  Have you tolerated food without any problems? Yes.    Have you been able to return to your normal activities? Yes.    Do you have any questions about your discharge instructions: Diet   No. Medications  No. Follow up visit  No.  Do you have questions or concerns about your Care? No.  Actions: * If pain score is 4 or above: No action needed, pain <4.

## 2017-04-04 ENCOUNTER — Other Ambulatory Visit: Payer: Self-pay | Admitting: Family Medicine

## 2017-04-04 NOTE — Telephone Encounter (Signed)
Seen 12 4 18 

## 2017-04-25 ENCOUNTER — Ambulatory Visit (INDEPENDENT_AMBULATORY_CARE_PROVIDER_SITE_OTHER): Payer: BLUE CROSS/BLUE SHIELD | Admitting: Family Medicine

## 2017-04-25 ENCOUNTER — Encounter: Payer: Self-pay | Admitting: Family Medicine

## 2017-04-25 VITALS — BP 118/74 | HR 65 | Resp 16 | Ht 70.0 in | Wt 164.0 lb

## 2017-04-25 DIAGNOSIS — I10 Essential (primary) hypertension: Secondary | ICD-10-CM

## 2017-04-25 DIAGNOSIS — F411 Generalized anxiety disorder: Secondary | ICD-10-CM

## 2017-04-25 DIAGNOSIS — Z87891 Personal history of nicotine dependence: Secondary | ICD-10-CM | POA: Diagnosis not present

## 2017-04-25 DIAGNOSIS — F409 Phobic anxiety disorder, unspecified: Secondary | ICD-10-CM | POA: Diagnosis not present

## 2017-04-25 DIAGNOSIS — F5105 Insomnia due to other mental disorder: Secondary | ICD-10-CM | POA: Diagnosis not present

## 2017-04-25 DIAGNOSIS — Z23 Encounter for immunization: Secondary | ICD-10-CM | POA: Diagnosis not present

## 2017-04-25 DIAGNOSIS — E785 Hyperlipidemia, unspecified: Secondary | ICD-10-CM

## 2017-04-25 LAB — COMPLETE METABOLIC PANEL WITH GFR
ALT: 19 U/L (ref 9–46)
AST: 17 U/L (ref 10–35)
Albumin: 4.3 g/dL (ref 3.6–5.1)
Alkaline Phosphatase: 76 U/L (ref 40–115)
BILIRUBIN TOTAL: 0.3 mg/dL (ref 0.2–1.2)
BUN: 16 mg/dL (ref 7–25)
CALCIUM: 9.1 mg/dL (ref 8.6–10.3)
CO2: 26 mmol/L (ref 20–31)
CREATININE: 0.8 mg/dL (ref 0.70–1.25)
Chloride: 106 mmol/L (ref 98–110)
Glucose, Bld: 92 mg/dL (ref 65–99)
Potassium: 4.7 mmol/L (ref 3.5–5.3)
Sodium: 141 mmol/L (ref 135–146)
TOTAL PROTEIN: 6.8 g/dL (ref 6.1–8.1)

## 2017-04-25 LAB — CBC
HEMATOCRIT: 43.7 % (ref 38.5–50.0)
HEMOGLOBIN: 14.8 g/dL (ref 13.2–17.1)
MCH: 31.1 pg (ref 27.0–33.0)
MCHC: 33.9 g/dL (ref 32.0–36.0)
MCV: 91.8 fL (ref 80.0–100.0)
MPV: 9.6 fL (ref 7.5–12.5)
PLATELETS: 246 10*3/uL (ref 140–400)
RBC: 4.76 MIL/uL (ref 4.20–5.80)
RDW: 13 % (ref 11.0–15.0)
WBC: 8.9 10*3/uL (ref 3.8–10.8)

## 2017-04-25 LAB — LIPID PANEL
CHOLESTEROL: 183 mg/dL (ref <200)
HDL: 65 mg/dL (ref >40)
LDL CALC: 101 mg/dL — AB (ref <100)
TRIGLYCERIDES: 85 mg/dL (ref <150)
Total CHOL/HDL Ratio: 2.8 Ratio (ref <5.0)
VLDL: 17 mg/dL (ref <30)

## 2017-04-25 NOTE — Assessment & Plan Note (Signed)
Hyperlipidemia:Low fat diet discussed and encouraged.   Lipid Panel  Lab Results  Component Value Date   CHOL 236 (H) 10/26/2016   HDL 57 10/26/2016   LDLCALC 155 (H) 10/26/2016   TRIG 118 10/26/2016   CHOLHDL 4.1 10/26/2016     Updated lab needed at/ before next visit. Needs statin due to CAD  On imaging study

## 2017-04-25 NOTE — Patient Instructions (Signed)
Physical exam Jan 25 or after , call if you need me sooner   Lipid, chem 7 and cBc today  Reduce to TWO cans beer at night.   EAT  more PLANTS  TDAP today  All the best  It is important that you exercise regularly at least 30 minutes 5 times a week. If you develop chest pain, have severe difficulty breathing, or feel very tired, stop exercising immediately and seek medical attention

## 2017-04-25 NOTE — Assessment & Plan Note (Addendum)
Sleep hygiene reviewed and written information offered also. Prescription sent for  medication needed. Encouraged to decrease / discontinue dependence on xanax at bedtime

## 2017-04-25 NOTE — Assessment & Plan Note (Signed)
Has annual chest scan every January

## 2017-04-25 NOTE — Progress Notes (Signed)
   Eugene Acevedo     MRN: [id number]      DOB: 1956/04/04   HPI Eugene Acevedo is here for follow up and re-evaluation of chronic medical conditions, medication management and review of any available recent lab and radiology data.  Preventive health is updated, specifically  Cancer screening and Immunization.   Questions or concerns regarding consultations or procedures which the PT has had in the interim are  addressed. The PT denies any adverse reactions to current medications since the last visit.  There are no new concerns.  There are no specific complaints   ROS Denies recent fever or chills. Denies sinus pressure, nasal congestion, ear pain or sore throat. Denies chest congestion, productive cough or wheezing. Denies chest pains, palpitations and leg swelling Denies abdominal pain, nausea, vomiting,diarrhea or constipation.   Denies dysuria, frequency, hesitancy or incontinence. Denies joint pain, swelling and limitation in mobility. Denies headaches, seizures, numbness, or tingling. Denies depression, anxiety or insomnia. Denies skin break down or rash.   PE  BP 118/74   Pulse 65   Resp 16   Ht 5\' 10"  (1.778 m)   Wt 164 lb (74.4 kg)   SpO2 96%   BMI 23.53 kg/m   Patient alert and oriented and in no cardiopulmonary distress.  HEENT: No facial asymmetry, EOMI,   oropharynx pink and moist.  Neck supple no JVD, no mass.  Chest: Clear to auscultation bilaterally.  CVS: S1, S2 no murmurs, no S3.Regular rate.  ABD: Soft non tender.   Ext: No edema  MS: Adequate ROM spine, shoulders, hips and knees.  Skin: healing burn to distal aspect of left lower extremity, area involved approx  4 inches total, 2 areas involved, No eryhema or purulent drainage from the wound  Psych: Good eye contact, normal affect. Memory intact not anxious or depressed appearing.  CNS: CN 2-12 intact, power,  normal throughout.no focal deficits noted.   Assessment & Plan  Essential  hypertension Controlled, no change in medication DASH diet and commitment to daily physical activity for a minimum of 30 minutes discussed and encouraged, as a part of hypertension management. The importance of attaining a healthy weight is also discussed.  BP/Weight 04/25/2017 03/02/2017 02/16/2017 10/26/2016 09/07/2016 08/11/2016 03/16/4314  Systolic BP 400 867 - 619 509 326 712  Diastolic BP 74 55 - 72 60 64 80  Wt. (Lbs) 164 165 165 173.4 174 175.08 171.12  BMI 23.53 23.68 23.68 23.52 23.6 25.12 24.55       GAD (generalized anxiety disorder) Controlled, no change in medication   Insomnia due to anxiety and fear Sleep hygiene reviewed and written information offered also. Prescription sent for  medication needed. Encouraged to decrease / discontinue dependence on xanax at bedtime   Hyperlipidemia LDL goal <100 Hyperlipidemia:Low fat diet discussed and encouraged.   Lipid Panel  Lab Results  Component Value Date   CHOL 236 (H) 10/26/2016   HDL 57 10/26/2016   LDLCALC 155 (H) 10/26/2016   TRIG 118 10/26/2016   CHOLHDL 4.1 10/26/2016     Updated lab needed at/ before next visit. Needs statin due to CAD  On imaging study  History of smoking 30 or more pack years Has annual chest scan every January  Need for Tdap vaccination Burn to left leg, still healing After obtaining informed consent, the vaccine is  administered by LPN.

## 2017-04-25 NOTE — Assessment & Plan Note (Addendum)
Burn to left leg, still healing After obtaining informed consent, the vaccine is  administered by LPN.

## 2017-04-25 NOTE — Assessment & Plan Note (Signed)
Controlled, no change in medication  

## 2017-04-25 NOTE — Assessment & Plan Note (Signed)
Controlled, no change in medication DASH diet and commitment to daily physical activity for a minimum of 30 minutes discussed and encouraged, as a part of hypertension management. The importance of attaining a healthy weight is also discussed.  BP/Weight 04/25/2017 03/02/2017 02/16/2017 10/26/2016 09/07/2016 08/11/2016 05/31/5620  Systolic BP 308 657 - 846 962 952 841  Diastolic BP 74 55 - 72 60 64 80  Wt. (Lbs) 164 165 165 173.4 174 175.08 171.12  BMI 23.53 23.68 23.68 23.52 23.6 25.12 24.55

## 2017-06-30 ENCOUNTER — Other Ambulatory Visit: Payer: Self-pay | Admitting: Family Medicine

## 2017-06-30 ENCOUNTER — Other Ambulatory Visit (HOSPITAL_COMMUNITY): Payer: Self-pay | Admitting: Family Medicine

## 2017-06-30 DIAGNOSIS — R1909 Other intra-abdominal and pelvic swelling, mass and lump: Secondary | ICD-10-CM

## 2017-07-04 ENCOUNTER — Ambulatory Visit (HOSPITAL_COMMUNITY): Admission: RE | Admit: 2017-07-04 | Payer: BLUE CROSS/BLUE SHIELD | Source: Ambulatory Visit

## 2017-07-13 ENCOUNTER — Other Ambulatory Visit: Payer: Self-pay | Admitting: Family Medicine

## 2017-07-14 ENCOUNTER — Emergency Department (HOSPITAL_BASED_OUTPATIENT_CLINIC_OR_DEPARTMENT_OTHER)
Admission: EM | Admit: 2017-07-14 | Discharge: 2017-07-14 | Disposition: A | Discharge status: Home / Self Care | Payer: BLUE CROSS/BLUE SHIELD | Attending: Emergency Medicine | Admitting: Emergency Medicine

## 2017-07-14 ENCOUNTER — Encounter (HOSPITAL_BASED_OUTPATIENT_CLINIC_OR_DEPARTMENT_OTHER): Payer: Self-pay

## 2017-07-14 DIAGNOSIS — Z87891 Personal history of nicotine dependence: Secondary | ICD-10-CM | POA: Diagnosis not present

## 2017-07-14 DIAGNOSIS — K4090 Unilateral inguinal hernia, without obstruction or gangrene, not specified as recurrent: Secondary | ICD-10-CM | POA: Diagnosis present

## 2017-07-14 DIAGNOSIS — Z79899 Other long term (current) drug therapy: Secondary | ICD-10-CM | POA: Insufficient documentation

## 2017-07-14 DIAGNOSIS — I10 Essential (primary) hypertension: Secondary | ICD-10-CM | POA: Diagnosis not present

## 2017-07-14 DIAGNOSIS — Z7982 Long term (current) use of aspirin: Secondary | ICD-10-CM | POA: Diagnosis not present

## 2017-07-14 NOTE — ED Triage Notes (Signed)
C/o left groin hernia x 3 weeks-states he was seen at [facility] for same last week-had [place] and had not received results-pt NAD-steady gait

## 2017-07-14 NOTE — ED Provider Notes (Signed)
[facility] DEPT [facility] Provider Note   CSN: [id number] Arrival date & time: 07/14/17  1208     History   Chief Complaint Chief Complaint  Patient presents with  . Hernia    HPI DAVONTA STROOT is a 61 y.o. male.  HPI  61 year old male presents with concern for a left inguinal hernia. He states that for the past 3 weeks had on and off swelling in his left groin. There is no testicular pain or swelling. No penile pain or dysuria. Has not had any abdominal pain or vomiting. This area that swells not painful but whenever he does any type of labor seems to pop out. He states he went to urgent care where they did an ultrasound of the area but did not tell him the results. He is looking for help on how to fix this hernia.  Past Medical History:  Diagnosis Date  . Anxiety   . Arthritis    wrists  . Erectile dysfunction   . GERD (gastroesophageal reflux disease)   . Hyperlipidemia    no meds now  . Hypertension   . Nicotine addiction   . Ulcer     Patient Active Problem List   Diagnosis Date Noted  . Need for Tdap vaccination 04/25/2017  . History of smoking 30 or more pack years 10/26/2016  . Skin lesions 03/23/2016  . Multiple lung nodules 09/23/2015  . Insomnia due to anxiety and fear 03/30/2015  . ED (erectile dysfunction) 09/10/2012  . Osteoarthritis 07/25/2011  . Hyperlipidemia LDL goal <100 12/17/2007  . GAD (generalized anxiety disorder) 12/17/2007  . Essential hypertension 12/17/2007  . GERD 12/17/2007    Past Surgical History:  Procedure Laterality Date  . COLONOSCOPY    . POLYPECTOMY    . UPPER GASTROINTESTINAL ENDOSCOPY         Home Medications    Prior to Admission medications   Medication Sig Start Date End Date Taking? Authorizing Provider  ALPRAZolam Duanne Moron) 1 MG tablet TAKE 1 TABLET BY MOUTH EVERY NIGHT AT BEDTIME 04/04/17   Fayrene Helper, MD  aspirin EC 81 MG tablet Take 1 tablet (81 mg total) by mouth daily. 09/10/12   Fayrene Helper, MD  cholecalciferol (VITAMIN D) 1000 units tablet Take 1,000 Units by mouth daily.    [provider]  CRANBERRY EXTRACT PO Take by mouth daily.    [provider]  Cyanocobalamin (VITAMIN B 12 PO) Take by mouth daily.    [provider]  lisinopril (PRINIVIL,ZESTRIL) 20 MG tablet TAKE 1 TABLET BY MOUTH EVERY DAY 11/22/16   Fayrene Helper, MD    Family History Family History  Problem Relation Age of Onset  . Hypertension Mother   . Hypertension Father        DJD   . Colon cancer Neg Hx   . Esophageal cancer Neg Hx   . Rectal cancer Neg Hx   . Stomach cancer Neg Hx   . Colon polyps Neg Hx     Social History Social History  Substance Use Topics  . Smoking status: Former Smoker    Packs/day: 0.50    Years: 40.00    Types: Cigarettes    Quit date: 11/07/2012  . Smokeless tobacco: Never Used  . Alcohol use Yes     Comment: 3 beers a day      Allergies   Patient has no known allergies.   Review of Systems Review of Systems  Constitutional: Negative for fever.  Gastrointestinal: Negative for abdominal pain, nausea and vomiting.  Genitourinary: Negative for dysuria, penile pain, scrotal swelling and testicular pain.  All other systems reviewed and are negative.    Physical Exam Updated Vital Signs BP (!) 128/103 (BP Location: Right Arm)   Pulse 84   Temp 98.3 F (36.8 C) (Oral)   Resp 18   Ht 5\' 10"  (1.778 m)   Wt 73.9 kg (163 lb)   SpO2 99%   BMI 23.39 kg/m   Physical Exam  Constitutional: He is oriented to person, place, and time. He appears well-developed and well-nourished.  HENT:  Head: Normocephalic and atraumatic.  Right Ear: External ear normal.  Left Ear: External ear normal.  Nose: Nose normal.  Eyes: Right eye exhibits no discharge. Left eye exhibits no discharge.  Neck: Neck supple.  Pulmonary/Chest: Effort normal.  Abdominal: Soft. He exhibits no distension. There is no tenderness. A hernia is present.  Hernia confirmed positive in the left inguinal area (small defect although no distention or swelling noted).  Genitourinary: Penis normal. Right testis shows no swelling and no tenderness. Left testis shows no swelling and no tenderness. Circumcised.  Musculoskeletal: He exhibits no edema.  Neurological: He is alert and oriented to person, place, and time.  Skin: Skin is warm and dry.  Nursing note and vitals reviewed.    ED Treatments / Results  Labs (all labs ordered are listed, but only abnormal results are displayed) Labs Reviewed - No data to display  EKG  EKG Interpretation None       Radiology No results found.  Procedures Procedures (including critical care time)  Medications Ordered in ED Medications - No data to display   Initial Impression / Assessment and Plan / ED Course  I have reviewed the triage vital signs and the nursing notes.  Pertinent labs & imaging results that were available during my care of the patient were reviewed by me and considered in my medical decision making (see chart for details).     Patient with an otherwise uncomplicated left inguinal hernia. The defect appears to be minimal at this time and he has no significant Case this time. I discussed strict return precautions but at this point discussed that the most important thing for him is to get in follow-up with a general surgeon. I have given him this referral.  Final Clinical Impressions(s) / ED Diagnoses   Final diagnoses:  Left inguinal hernia    New Prescriptions New Prescriptions   No medications on file     Sherwood Gambler, MD 07/14/17 1531

## 2017-07-14 NOTE — ED Notes (Signed)
No questions regards d/c instructions, signature pad in room not working

## 2017-07-17 NOTE — Telephone Encounter (Signed)
Seen [phone]

## 2017-08-15 ENCOUNTER — Other Ambulatory Visit: Payer: Self-pay | Admitting: Surgery

## 2017-08-15 DIAGNOSIS — R1909 Other intra-abdominal and pelvic swelling, mass and lump: Secondary | ICD-10-CM

## 2017-08-18 ENCOUNTER — Ambulatory Visit
Admission: RE | Admit: 2017-08-18 | Discharge: 2017-08-18 | Disposition: A | Discharge status: Home / Self Care | Payer: BLUE CROSS/BLUE SHIELD | Source: Ambulatory Visit | Attending: Surgery | Admitting: Surgery

## 2017-08-18 DIAGNOSIS — R1909 Other intra-abdominal and pelvic swelling, mass and lump: Secondary | ICD-10-CM

## 2017-08-18 MED ORDER — IOPAMIDOL (ISOVUE-300) INJECTION 61%
100.0000 mL | Freq: Once | INTRAVENOUS | Status: AC | PRN
  Administered 2017-08-18: 100 mL via INTRAVENOUS

## 2017-08-29 ENCOUNTER — Other Ambulatory Visit: Payer: Self-pay | Admitting: Family Medicine

## 2017-08-31 ENCOUNTER — Other Ambulatory Visit: Payer: Self-pay | Admitting: Family Medicine

## 2017-08-31 MED ORDER — ALPRAZOLAM 1 MG PO TABS: 1.0000 mg | ORAL_TABLET | Freq: Every day | ORAL | 1 refills | Status: DC

## 2017-08-31 NOTE — Telephone Encounter (Signed)
Seen [phone]

## 2017-09-14 ENCOUNTER — Other Ambulatory Visit: Payer: Self-pay | Admitting: Family Medicine

## 2017-09-19 ENCOUNTER — Ambulatory Visit: Payer: Self-pay | Admitting: Surgery

## 2017-09-19 NOTE — H&P (Signed)
History of Present Illness Eugene Gave M. Shelbia Scinto MD; 09/19/2017 11:28 AM) Patient words: CC: Here for f/u for left groin pain following a CAT scan. HPI: Eugene Acevedo is a very pleasant 61 year old Acevedo here with left groin pain.  When he was seen in the office last a clear inguinal defect was not palpable on exam and so a CAT scan was obtained.  The CAT scan was read as normal but there was thickness within the spermatic cord which may indicate effecting hernia.  In the office today while standing he has a large bulge in his left groin that does not have an associated scrotal component.  This is reducible.  This is now causing pain which it wasn't before with activity.  He works as a Pension scheme manager and the discomfort will stop her from doing his work in order to reduce it and then continue.  He denies history of this being incarcerated. PMH: Hypertension (well controlled on lisinopril) PSH: No prior abdominal operations FHx: Denies FHx of malignancy Social: Denies use of tobacco/EtOH/drugs - reformed tobacco user ROS: A comprehensive 10 system review of systems was completed with the patient and pertinent findings as noted above.  The patient is a 61 year old male.   Allergies Alean Rinne, [place]; 09/19/2017 11:12 AM) No Known Drug Allergies  [07/25/2017]: Allergies Reconciled    Medication History Alean Rinne, [place]; 09/19/2017 11:12 AM) ALPRAZolam  (1MG  Tablet, Oral) Active. Sildenafil Citrate  (100MG  Tablet, Oral) Active. Medications Reconciled     Review of Systems Eugene Gave M. Barth Trella MD; 09/19/2017 11:28 AM) General Not Present- Appetite Loss, Chills, Fatigue, Fever, Night Sweats, Weight Gain and Weight Loss. Skin Not Present- Change in Wart/Mole, Dryness, Hives, Jaundice, New Lesions, Non-Healing Wounds, Rash and Ulcer. HEENT Not Present- Earache, Hearing Loss, Hoarseness, Nose Bleed, Oral Ulcers, Ringing in the Ears, Seasonal Allergies, Sinus Pain, Sore Throat, Visual  Disturbances, Wears glasses/contact lenses and Yellow Eyes. Respiratory Not Present- Bloody sputum, Chronic Cough, Difficulty Breathing, Snoring and Wheezing. Breast Not Present- Breast Mass, Breast Pain, Nipple Discharge and Skin Changes. Cardiovascular Not Present- Chest Pain, Difficulty Breathing Lying Down, Leg Cramps, Palpitations, Rapid Heart Rate, Shortness of Breath and Swelling of Extremities. Gastrointestinal Present- Indigestion. Not Present- Abdominal Pain, Bloating, Bloody Stool, Change in Bowel Habits, Chronic diarrhea, Constipation, Difficulty Swallowing, Excessive gas, Gets full quickly at meals, Hemorrhoids, Nausea, Rectal Pain and Vomiting. Male Genitourinary Present- Frequency. Not Present- Blood in Urine, Change in Urinary Stream, Impotence, Nocturia, Painful Urination, Urgency and Urine Leakage. Musculoskeletal Not Present- Back Pain, Joint Pain, Joint Stiffness, Muscle Pain, Muscle Weakness and Swelling of Extremities. Neurological Not Present- Decreased Memory, Fainting, Headaches, Numbness, Seizures, Tingling, Tremor, Trouble walking and Weakness. Psychiatric Present- Anxiety. Not Present- Bipolar, Change in Sleep Pattern, Depression, Fearful and Frequent crying. Endocrine Not Present- Cold Intolerance, Excessive Hunger, Hair Changes, Heat Intolerance and New Diabetes. Hematology Present- Easy Bruising. Not Present- Blood Thinners, Excessive bleeding, Gland problems, HIV and Persistent Infections.  Vitals Eugene Acevedo; 09/19/2017 11:12 AM) 09/19/2017 11:11 AM Weight: 165 lb   Height: 70 in  Body Surface Area: 1.92 m   Body Mass Index: 23.67 kg/m   Temp.: 98.2 F    Pulse: 96 (Regular)    BP: 145/82 (Sitting, Left Arm, Standard)       Physical Exam Eugene Gave M. Lenford Beddow MD; 09/19/2017 11:29 AM) The physical exam findings are as follows: Note: Constitutional: No acute distress; conversant; no deformities Eyes: Moist conjunctiva; no lid lag; anicteric sclerae;  pupils equal round and  reactive to light Neck: Trachea midline; no palpable thyromegaly Lungs: Normal respiratory effort; no tactile fremitus CV: Regular rate and rhythm; no palpable thrill; no pitting edema GI: Abdomen soft, nontender, nondistended; no palpable hepatosplenomegaly; left groin with a bulge while standing; freely reducible; no scrotal component MSK: Normal gait; no clubbing/cyanosis Psychiatric: Appropriate affect; alert and oriented 3 Lymphatic: No palpable cervical or axillary lymphadenopathy    Assessment & Plan Eugene Gave M. Vanette Noguchi MD; 09/19/2017 11:31 AM) BULGE IN GROIN AREA (R19.09) Impression: 61 year old male with complaints of a left inguinal bulge now detectable on exam; -He has since developed symptoms from this-namely discomfort with activity. For these reasons I believe he would benefit from an inguinal hernia repair -The anatomy and physiology of the abdominal wall and inguinal canal was discussed. This is done with pictures. The pathophysiology of inguinal hernias as well as indications for surgery were discussed. Additionally, a handout on inguinal hernias and surgery was provided -The planned procedure including use of mesh, material risks (including, but not limited to, pain, bleeding, infection, scarring, need for blood transfusion, damage to surrounding structures-blood vessels/nerves/viscus/organs, need for additional procedures, recurrence, chronic pain, mesh complications, ischemic orchitis, pneumonia, heart attack, stroke, death) benefits and alternatives to surgery were discussed at length. I noted a good probability that the procedure help improve their symptoms. The patient's questions were answered to his satisfaction and he elected to proceed with surgery  Signed electronically by Eugene Roup, MD (09/19/2017 11:32 AM)

## 2017-09-22 ENCOUNTER — Encounter (HOSPITAL_COMMUNITY): Payer: Self-pay

## 2017-09-22 NOTE — Pre-Procedure Instructions (Signed)
The following are in epic: Last office note Dr. Moshe Cipro 04/25/17 CT abd/pelvis 08/18/17 CT chest 11/02/16

## 2017-09-22 NOTE — Patient Instructions (Signed)
Eugene Acevedo  09/22/2017   Your procedure is scheduled on: Wednesday, Jan. 2, 2019   Report to [facility] Main  Entrance   Take [place]  elevators to 3rd floor to  [place] at 1:00 PM.   Call this number if you have problems the morning of surgery [phone]    Remember: ONLY 1 PERSON MAY GO WITH YOU TO SHORT STAY TO GET  READY MORNING OF [place].   Do not eat food or drink liquids :After Midnight.    Take these medicines the morning of surgery with A SIP OF WATER: Xanax if needed                               You may not have any metal on your body including jewelry and body  piercings               Do not wear make-up, lotions, powders, perfumes,  or deodorant             Do not wear nail polish.  Do not shave  48 hours prior to surgery.                Do not bring valuables to the hospital. [place].   Contacts, dentures or bridgework may not be worn into surgery.   Patients discharged the day of surgery will not be allowed to drive home.   Name and phone number of your driver:   Special Instructions: N/A              Please read over the following fact sheets you were given: _____________________________________________________________________        [facility] - Preparing for Surgery Before surgery, you can play an important role.  Because skin is not sterile, your skin needs to be as free of germs as possible.  You can reduce the number of germs on your skin by washing with CHG (chlorahexidine gluconate) soap before surgery.  CHG is an antiseptic cleaner which kills germs and bonds with the skin to continue killing germs even after washing. Please DO NOT use if you have an allergy to CHG or antibacterial soaps.  If your skin becomes reddened/irritated stop using the CHG and inform your nurse when you arrive at Short Stay. Do not shave (including legs and underarms) for at least 48  hours prior to the first CHG shower.  You may shave your face/neck.  Please follow these instructions carefully:  1.  Shower with CHG Soap the night before surgery and the  morning of surgery.  2.  If you choose to wash your hair, wash your hair first as usual with your normal  shampoo.  3.  After you shampoo, rinse your hair and body thoroughly to remove the shampoo.                             4.  Use CHG as you would any other liquid soap.  You can apply chg directly to the skin and wash.  Gently with a scrungie or clean washcloth.  5.  Apply the CHG Soap to your body ONLY FROM THE NECK DOWN.   Do  not use on face/ open                           Wound or open sores. Avoid contact with eyes, ears mouth and   genitals (private parts).                       Wash face,  Genitals (private parts) with your normal soap.             6.  Wash thoroughly, paying special attention to the area where your    surgery  will be performed.  7.  Thoroughly rinse your body with warm water from the neck down.  8.  DO NOT shower/wash with your normal soap after using and rinsing off the CHG Soap.                9.  Pat yourself dry with a clean towel.            10.  Wear clean pajamas.            11.  Place clean sheets on your bed the night of your first shower and do not  sleep with pets. Day of Surgery : Do not apply any lotions/deodorants the morning of surgery.  Please wear clean clothes to the hospital/surgery center.  FAILURE TO FOLLOW THESE INSTRUCTIONS MAY RESULT IN THE CANCELLATION OF YOUR SURGERY  PATIENT SIGNATURE_________________________________  NURSE SIGNATURE__________________________________  ________________________________________________________________________

## 2017-09-28 NOTE — Patient Instructions (Addendum)
Eugene Acevedo  09/28/2017   Your procedure is scheduled on: 10-04-16  Report to [facility] Main  Entrance Take [place] [facility] to 3rd floor to  [place] at 1:00 PM.   Call this number if you have problems the morning of surgery [phone]    Remember: ONLY 1 PERSON MAY GO WITH YOU TO SHORT STAY TO GET  READY MORNING OF [place].  Do not eat food or drink liquids :After Midnight.You may also have a Clear Liquid Diet from Midnight until 9:00 AM. After 9:00 AM, nothing until after surgery.     CLEAR LIQUID DIET   Foods Allowed                                                                     Foods Excluded  Coffee and tea, regular and decaf                             liquids that you cannot  Plain Jell-O in any flavor                                             see through such as: Fruit ices (not with fruit pulp)                                     milk, soups, orange juice  Iced Popsicles                                    All solid food Carbonated beverages, regular and diet                                    Cranberry, grape and apple juices Sports drinks like Gatorade Lightly seasoned clear broth or consume(fat free) Sugar, honey syrup  Sample Menu Breakfast                                Lunch                                     Supper Cranberry juice                    Beef broth                            Chicken broth Jell-O                                     Grape juice  Apple juice Coffee or tea                        Jell-O                                      Popsicle                                                Coffee or tea                        Coffee or tea  _____________________________________________________________________     Take these medicines the morning of surgery with A SIP OF WATER: None                                You may not have any metal on your body including hair pins  and              piercings  Do not wear jewelry, lotions, powders or deodorant             Men may shave face and neck.   Do not bring valuables to the hospital. [place].  Contacts, dentures or bridgework may not be worn into surgery.                Driver: Jamse Arn [phone]               Please read over the following fact sheets you were given: _____________________________________________________________________          [facility] - Preparing for Surgery Before surgery, you can play an important role.  Because skin is not sterile, your skin needs to be as free of germs as possible.  You can reduce the number of germs on your skin by washing with CHG (chlorahexidine gluconate) soap before surgery.  CHG is an antiseptic cleaner which kills germs and bonds with the skin to continue killing germs even after washing. Please DO NOT use if you have an allergy to CHG or antibacterial soaps.  If your skin becomes reddened/irritated stop using the CHG and inform your nurse when you arrive at Short Stay. Do not shave (including legs and underarms) for at least 48 hours prior to the first CHG shower.  You may shave your face/neck. Please follow these instructions carefully:  1.  Shower with CHG Soap the night before surgery and the  morning of Surgery.  2.  If you choose to wash your hair, wash your hair first as usual with your  normal  shampoo.  3.  After you shampoo, rinse your hair and body thoroughly to remove the  shampoo.                           4.  Use CHG as you would any other liquid soap.  You can apply chg directly  to the skin and wash  Gently with a scrungie or clean washcloth.  5.  Apply the CHG Soap to your body ONLY FROM THE NECK DOWN.   Do not use on face/ open                           Wound or open sores. Avoid contact with eyes, ears mouth and genitals (private parts).                       Wash  face,  Genitals (private parts) with your normal soap.             6.  Wash thoroughly, paying special attention to the area where your surgery  will be performed.  7.  Thoroughly rinse your body with warm water from the neck down.  8.  DO NOT shower/wash with your normal soap after using and rinsing off  the CHG Soap.                9.  Pat yourself dry with a clean towel.            10.  Wear clean pajamas.            11.  Place clean sheets on your bed the night of your first shower and do not  sleep with pets. Day of Surgery : Do not apply any lotions/deodorants the morning of surgery.  Please wear clean clothes to the hospital/surgery center.  FAILURE TO FOLLOW THESE INSTRUCTIONS MAY RESULT IN THE CANCELLATION OF YOUR SURGERY PATIENT SIGNATURE_________________________________  NURSE SIGNATURE__________________________________  ________________________________________________________________________

## 2017-09-29 ENCOUNTER — Encounter (HOSPITAL_COMMUNITY)
Admission: RE | Admit: 2017-09-29 | Discharge: 2017-09-29 | Disposition: A | Discharge status: Home / Self Care | Payer: BLUE CROSS/BLUE SHIELD | Source: Ambulatory Visit | Attending: Surgery | Admitting: Surgery

## 2017-09-29 ENCOUNTER — Encounter (HOSPITAL_COMMUNITY): Payer: Self-pay

## 2017-09-29 ENCOUNTER — Other Ambulatory Visit: Payer: Self-pay

## 2017-09-29 DIAGNOSIS — Z01812 Encounter for preprocedural laboratory examination: Secondary | ICD-10-CM | POA: Insufficient documentation

## 2017-09-29 DIAGNOSIS — Z01810 Encounter for preprocedural cardiovascular examination: Secondary | ICD-10-CM | POA: Diagnosis present

## 2017-09-29 HISTORY — DX: Diverticulitis of large intestine without perforation or abscess without bleeding: K57.32

## 2017-09-29 HISTORY — DX: Generalized anxiety disorder: F41.1

## 2017-09-29 HISTORY — DX: Adverse effect of unspecified anesthetic, initial encounter: T41.45XA

## 2017-09-29 HISTORY — DX: Other hemorrhoids: K64.8

## 2017-09-29 HISTORY — DX: Other nonspecific abnormal finding of lung field: R91.8

## 2017-09-29 HISTORY — DX: Atherosclerosis of aorta: I70.0

## 2017-09-29 HISTORY — DX: Insomnia, unspecified: G47.00

## 2017-09-29 HISTORY — DX: Other complications of anesthesia, initial encounter: T88.59XA

## 2017-09-29 LAB — CBC WITH DIFFERENTIAL/PLATELET
BASOS PCT: 1 %
Basophils Absolute: 0 10*3/uL (ref 0.0–0.1)
EOS ABS: 0.3 10*3/uL (ref 0.0–0.7)
Eosinophils Relative: 5 %
HCT: 42.3 % (ref 39.0–52.0)
HEMOGLOBIN: 14.3 g/dL (ref 13.0–17.0)
Lymphocytes Relative: 32 %
Lymphs Abs: 1.9 10*3/uL (ref 0.7–4.0)
MCH: 31 pg (ref 26.0–34.0)
MCHC: 33.8 g/dL (ref 30.0–36.0)
MCV: 91.6 fL (ref 78.0–100.0)
MONOS PCT: 7 %
Monocytes Absolute: 0.4 10*3/uL (ref 0.1–1.0)
NEUTROS PCT: 55 %
Neutro Abs: 3.2 10*3/uL (ref 1.7–7.7)
Platelets: 223 10*3/uL (ref 150–400)
RBC: 4.62 MIL/uL (ref 4.22–5.81)
RDW: 12.6 % (ref 11.5–15.5)
WBC: 5.9 10*3/uL (ref 4.0–10.5)

## 2017-09-29 LAB — COMPREHENSIVE METABOLIC PANEL
ALBUMIN: 4.2 g/dL (ref 3.5–5.0)
ALK PHOS: 80 U/L (ref 38–126)
ALT: 14 U/L — ABNORMAL LOW (ref 17–63)
ANION GAP: 7 (ref 5–15)
AST: 17 U/L (ref 15–41)
BUN: 15 mg/dL (ref 6–20)
CHLORIDE: 106 mmol/L (ref 101–111)
CO2: 26 mmol/L (ref 22–32)
Calcium: 9 mg/dL (ref 8.9–10.3)
Creatinine, Ser: 0.82 mg/dL (ref 0.61–1.24)
GFR calc Af Amer: >60 mL/min (ref >60)
GFR calc non Af Amer: >60 mL/min (ref >60)
GLUCOSE: 100 mg/dL — AB (ref 65–99)
POTASSIUM: 4 mmol/L (ref 3.5–5.1)
SODIUM: 139 mmol/L (ref 135–145)
Total Bilirubin: 0.5 mg/dL (ref 0.3–1.2)
Total Protein: 7.1 g/dL (ref 6.5–8.1)

## 2017-10-04 ENCOUNTER — Ambulatory Visit (HOSPITAL_COMMUNITY)
Admission: RE | Admit: 2017-10-04 | Discharge: 2017-10-04 | Disposition: A | Discharge status: Home / Self Care | Payer: BLUE CROSS/BLUE SHIELD | Source: Ambulatory Visit | Attending: Surgery | Admitting: Surgery

## 2017-10-04 ENCOUNTER — Ambulatory Visit (HOSPITAL_COMMUNITY): Payer: BLUE CROSS/BLUE SHIELD | Admitting: Anesthesiology

## 2017-10-04 ENCOUNTER — Encounter (HOSPITAL_COMMUNITY)
Admission: RE | Disposition: A | Discharge status: Home / Self Care | Payer: Self-pay | Source: Ambulatory Visit | Attending: Surgery

## 2017-10-04 ENCOUNTER — Encounter: Payer: Self-pay | Admitting: Family Medicine

## 2017-10-04 ENCOUNTER — Other Ambulatory Visit: Payer: Self-pay

## 2017-10-04 ENCOUNTER — Ambulatory Visit: Payer: BLUE CROSS/BLUE SHIELD | Admitting: Family Medicine

## 2017-10-04 ENCOUNTER — Encounter (HOSPITAL_COMMUNITY): Payer: Self-pay | Admitting: *Deleted

## 2017-10-04 VITALS — BP 120/82 | HR 74 | Resp 16 | Ht 70.0 in | Wt 165.0 lb

## 2017-10-04 DIAGNOSIS — Z125 Encounter for screening for malignant neoplasm of prostate: Secondary | ICD-10-CM | POA: Diagnosis not present

## 2017-10-04 DIAGNOSIS — E785 Hyperlipidemia, unspecified: Secondary | ICD-10-CM | POA: Diagnosis not present

## 2017-10-04 DIAGNOSIS — M1990 Unspecified osteoarthritis, unspecified site: Secondary | ICD-10-CM | POA: Insufficient documentation

## 2017-10-04 DIAGNOSIS — Z87891 Personal history of nicotine dependence: Secondary | ICD-10-CM | POA: Insufficient documentation

## 2017-10-04 DIAGNOSIS — K219 Gastro-esophageal reflux disease without esophagitis: Secondary | ICD-10-CM | POA: Insufficient documentation

## 2017-10-04 DIAGNOSIS — D176 Benign lipomatous neoplasm of spermatic cord: Secondary | ICD-10-CM | POA: Diagnosis not present

## 2017-10-04 DIAGNOSIS — F5105 Insomnia due to other mental disorder: Secondary | ICD-10-CM

## 2017-10-04 DIAGNOSIS — F409 Phobic anxiety disorder, unspecified: Secondary | ICD-10-CM

## 2017-10-04 DIAGNOSIS — I10 Essential (primary) hypertension: Secondary | ICD-10-CM

## 2017-10-04 DIAGNOSIS — K4090 Unilateral inguinal hernia, without obstruction or gangrene, not specified as recurrent: Secondary | ICD-10-CM | POA: Diagnosis present

## 2017-10-04 DIAGNOSIS — F419 Anxiety disorder, unspecified: Secondary | ICD-10-CM | POA: Diagnosis not present

## 2017-10-04 DIAGNOSIS — N529 Male erectile dysfunction, unspecified: Secondary | ICD-10-CM | POA: Diagnosis not present

## 2017-10-04 HISTORY — PX: INGUINAL HERNIA REPAIR: SHX194

## 2017-10-04 HISTORY — PX: INSERTION OF MESH: SHX5868

## 2017-10-04 SURGERY — REPAIR, HERNIA, INGUINAL, ADULT
Anesthesia: General | Site: Inguinal | Laterality: Left

## 2017-10-04 MED ORDER — GABAPENTIN 300 MG PO CAPS
300.0000 mg | ORAL_CAPSULE | ORAL | Status: AC
  Administered 2017-10-04: 300 mg via ORAL
  Filled 2017-10-04: qty 1

## 2017-10-04 MED ORDER — PROPOFOL 10 MG/ML IV BOLUS
INTRAVENOUS | Status: AC
Start: 2017-10-04 — End: ?
  Filled 2017-10-04: qty 20

## 2017-10-04 MED ORDER — LIDOCAINE 2% (20 MG/ML) 5 ML SYRINGE
INTRAMUSCULAR | Status: AC
  Filled 2017-10-04: qty 5

## 2017-10-04 MED ORDER — SUCCINYLCHOLINE CHLORIDE 200 MG/10ML IV SOSY
PREFILLED_SYRINGE | INTRAVENOUS | Status: DC | PRN
  Administered 2017-10-04: 140 mg via INTRAVENOUS

## 2017-10-04 MED ORDER — LACTATED RINGERS IV SOLN
INTRAVENOUS | Status: DC
  Administered 2017-10-04 (×2): via INTRAVENOUS

## 2017-10-04 MED ORDER — ONDANSETRON HCL 4 MG/2ML IJ SOLN
INTRAMUSCULAR | Status: DC | PRN
  Administered 2017-10-04: 4 mg via INTRAVENOUS

## 2017-10-04 MED ORDER — BUPIVACAINE-EPINEPHRINE 0.25% -1:200000 IJ SOLN
INTRAMUSCULAR | Status: DC | PRN
  Administered 2017-10-04: 30 mL

## 2017-10-04 MED ORDER — BUPIVACAINE-EPINEPHRINE 0.25% -1:200000 IJ SOLN
INTRAMUSCULAR | Status: AC
  Filled 2017-10-04: qty 1

## 2017-10-04 MED ORDER — CELECOXIB 200 MG PO CAPS
200.0000 mg | ORAL_CAPSULE | ORAL | Status: AC
  Administered 2017-10-04: 200 mg via ORAL
  Filled 2017-10-04: qty 1

## 2017-10-04 MED ORDER — LISINOPRIL 20 MG PO TABS: 20.0000 mg | ORAL_TABLET | Freq: Every day | ORAL | 1 refills | Status: DC

## 2017-10-04 MED ORDER — ACETAMINOPHEN 325 MG PO TABS: 325.0000 mg | ORAL_TABLET | ORAL | Status: DC | PRN

## 2017-10-04 MED ORDER — 0.9 % SODIUM CHLORIDE (POUR BTL) OPTIME
TOPICAL | Status: DC | PRN
  Administered 2017-10-04: 1000 mL

## 2017-10-04 MED ORDER — OXYCODONE HCL 5 MG PO TABS: 5.0000 mg | ORAL_TABLET | Freq: Once | ORAL | Status: DC | PRN

## 2017-10-04 MED ORDER — CHLORHEXIDINE GLUCONATE CLOTH 2 % EX PADS: 6.0000 | MEDICATED_PAD | Freq: Once | CUTANEOUS | Status: DC

## 2017-10-04 MED ORDER — ROCURONIUM BROMIDE 50 MG/5ML IV SOSY
PREFILLED_SYRINGE | INTRAVENOUS | Status: AC
  Filled 2017-10-04: qty 5

## 2017-10-04 MED ORDER — OXYCODONE HCL 5 MG/5ML PO SOLN
5.0000 mg | Freq: Once | ORAL | Status: DC | PRN
  Filled 2017-10-04: qty 5

## 2017-10-04 MED ORDER — SUGAMMADEX SODIUM 200 MG/2ML IV SOLN
INTRAVENOUS | Status: DC | PRN
  Administered 2017-10-04: 100 mg via INTRAVENOUS

## 2017-10-04 MED ORDER — MEPERIDINE HCL 50 MG/ML IJ SOLN: 6.2500 mg | INTRAMUSCULAR | Status: DC | PRN

## 2017-10-04 MED ORDER — ROCURONIUM BROMIDE 10 MG/ML (PF) SYRINGE
PREFILLED_SYRINGE | INTRAVENOUS | Status: DC | PRN
  Administered 2017-10-04: 50 mg via INTRAVENOUS

## 2017-10-04 MED ORDER — KETOROLAC TROMETHAMINE 30 MG/ML IJ SOLN: 30.0000 mg | Freq: Once | INTRAMUSCULAR | Status: DC | PRN

## 2017-10-04 MED ORDER — FENTANYL CITRATE (PF) 100 MCG/2ML IJ SOLN
INTRAMUSCULAR | Status: DC | PRN
  Administered 2017-10-04 (×2): 50 ug via INTRAVENOUS
  Administered 2017-10-04: 100 ug via INTRAVENOUS

## 2017-10-04 MED ORDER — FENTANYL CITRATE (PF) 100 MCG/2ML IJ SOLN: 25.0000 ug | INTRAMUSCULAR | Status: DC | PRN

## 2017-10-04 MED ORDER — FENTANYL CITRATE (PF) 100 MCG/2ML IJ SOLN
100.0000 ug | Freq: Once | INTRAMUSCULAR | Status: AC
  Administered 2017-10-04: 100 ug via INTRAVENOUS

## 2017-10-04 MED ORDER — MIDAZOLAM HCL 2 MG/2ML IJ SOLN
2.0000 mg | Freq: Once | INTRAMUSCULAR | Status: AC
  Administered 2017-10-04: 2 mg via INTRAVENOUS

## 2017-10-04 MED ORDER — SUCCINYLCHOLINE CHLORIDE 200 MG/10ML IV SOSY
PREFILLED_SYRINGE | INTRAVENOUS | Status: AC
  Filled 2017-10-04: qty 10

## 2017-10-04 MED ORDER — FENTANYL CITRATE (PF) 100 MCG/2ML IJ SOLN
INTRAMUSCULAR | Status: AC
  Filled 2017-10-04: qty 2

## 2017-10-04 MED ORDER — ALPRAZOLAM 1 MG PO TABS: 1.0000 mg | ORAL_TABLET | Freq: Every day | ORAL | 1 refills | Status: DC

## 2017-10-04 MED ORDER — ALPRAZOLAM 1 MG PO TABS: 1.0000 mg | ORAL_TABLET | Freq: Every day | ORAL | 5 refills | Status: DC

## 2017-10-04 MED ORDER — MIDAZOLAM HCL 2 MG/2ML IJ SOLN
INTRAMUSCULAR | Status: AC
  Filled 2017-10-04: qty 2

## 2017-10-04 MED ORDER — BUPIVACAINE LIPOSOME 1.3 % IJ SUSP
20.0000 mL | Freq: Once | INTRAMUSCULAR | Status: AC
  Administered 2017-10-04: 20 mL
  Filled 2017-10-04: qty 20

## 2017-10-04 MED ORDER — DEXAMETHASONE SODIUM PHOSPHATE 10 MG/ML IJ SOLN
INTRAMUSCULAR | Status: DC | PRN
  Administered 2017-10-04: 10 mg via INTRAVENOUS

## 2017-10-04 MED ORDER — ACETAMINOPHEN 160 MG/5ML PO SOLN: 325.0000 mg | ORAL | Status: DC | PRN

## 2017-10-04 MED ORDER — HYDROCODONE-ACETAMINOPHEN 10-325 MG PO TABS: 1.0000 | ORAL_TABLET | Freq: Four times a day (QID) | ORAL | 0 refills | Status: DC | PRN

## 2017-10-04 MED ORDER — ONDANSETRON HCL 4 MG/2ML IJ SOLN: 4.0000 mg | Freq: Once | INTRAMUSCULAR | Status: DC | PRN

## 2017-10-04 MED ORDER — CEFAZOLIN SODIUM-DEXTROSE 2-4 GM/100ML-% IV SOLN
2.0000 g | INTRAVENOUS | Status: AC
  Administered 2017-10-04: 2 g via INTRAVENOUS
  Filled 2017-10-04: qty 100

## 2017-10-04 MED ORDER — ACETAMINOPHEN 500 MG PO TABS
1000.0000 mg | ORAL_TABLET | ORAL | Status: AC
  Administered 2017-10-04: 1000 mg via ORAL
  Filled 2017-10-04: qty 2

## 2017-10-04 MED ORDER — ROPIVACAINE HCL 7.5 MG/ML IJ SOLN
INTRAMUSCULAR | Status: DC | PRN
  Administered 2017-10-04: 25 mL via PERINEURAL

## 2017-10-04 MED ORDER — PROPOFOL 10 MG/ML IV BOLUS
INTRAVENOUS | Status: DC | PRN
  Administered 2017-10-04: 160 mg via INTRAVENOUS

## 2017-10-04 MED ORDER — SUGAMMADEX SODIUM 200 MG/2ML IV SOLN
INTRAVENOUS | Status: AC
  Filled 2017-10-04: qty 2

## 2017-10-04 SURGICAL SUPPLY — 29 items
ADH SKN CLS APL DERMABOND .7 (GAUZE/BANDAGES/DRESSINGS) ×1
COVER SURGICAL LIGHT HANDLE (MISCELLANEOUS) ×2 IMPLANT
DECANTER SPIKE VIAL GLASS SM (MISCELLANEOUS) ×2 IMPLANT
DERMABOND ADVANCED (GAUZE/BANDAGES/DRESSINGS) ×1
DERMABOND ADVANCED .7 DNX12 (GAUZE/BANDAGES/DRESSINGS) IMPLANT
DRAIN PENROSE 18X1/2 LTX STRL (DRAIN) ×2 IMPLANT
DRAPE LAPAROSCOPIC ABDOMINAL (DRAPES) ×1 IMPLANT
DRAPE LAPAROTOMY TRNSV 102X78 (DRAPE) ×2 IMPLANT
GLOVE BIO SURGEON STRL SZ7.5 (GLOVE) ×2 IMPLANT
GLOVE INDICATOR 8.0 STRL GRN (GLOVE) ×2 IMPLANT
GOWN STRL REUS W/TWL XL LVL3 (GOWN DISPOSABLE) ×4 IMPLANT
KIT BASIN OR (CUSTOM PROCEDURE TRAY) ×2 IMPLANT
MESH ULTRAPRO 3X6 7.6X15CM (Mesh General) ×1 IMPLANT
NEEDLE HYPO 22GX1.5 SAFETY (NEEDLE) IMPLANT
NEEDLE SPNL 22GX7 SPINOC (NEEDLE) ×1 IMPLANT
PACK GENERAL/GYN (CUSTOM PROCEDURE TRAY) ×2 IMPLANT
SUT ETHIBOND 0 MO6 C/R (SUTURE) ×4 IMPLANT
SUT MNCRL AB 4-0 PS2 18 (SUTURE) IMPLANT
SUT VIC AB 2-0 SH 27 (SUTURE) ×2
SUT VIC AB 2-0 SH 27X BRD (SUTURE) ×1 IMPLANT
SUT VIC AB 3-0 SH 27 (SUTURE) ×2
SUT VIC AB 3-0 SH 27X BRD (SUTURE) ×1 IMPLANT
SUT VICRYL 0 UR6 27IN ABS (SUTURE) ×1 IMPLANT
SYR 20CC LL (SYRINGE) IMPLANT
SYR 30ML LL (SYRINGE) ×2 IMPLANT
TOWEL OR 17X26 10 PK STRL BLUE (TOWEL DISPOSABLE) ×2 IMPLANT
TOWEL OR NON WOVEN STRL DISP B (DISPOSABLE) ×2 IMPLANT
TRAY FOLEY W/METER SILVER 14FR (SET/KITS/TRAYS/PACK) ×2 IMPLANT
TRAY FOLEY W/METER SILVER 16FR (SET/KITS/TRAYS/PACK) ×2 IMPLANT

## 2017-10-04 NOTE — H&P (Signed)
H&P Update  The H&P on the chart was reviewed from 09/19/17 by myself with the patient. The patient reports no interval changes.  Vitals:   10/04/17 1252 10/04/17 1306  BP: 126/83   Pulse: 78   Resp: 16   Temp: 98 F (36.7 C)   TempSrc: Oral   SpO2: 99%   Weight:  74.8 kg (165 lb)  Height:  5\' 9"  (1.753 m)   PLAN -OR today for open left inguinal hernia repair with mesh; all other indicated procedures. -The planned procedure, material risks, use of mesh, benefits and alternatives were discussed at length with the patient. His questions were answered to his satisfaction and he elected to proceed with surgery.  Sharon Mt. Dema Severin, M.D. General and Colorectal Surgery [facility]

## 2017-10-04 NOTE — Transfer of Care (Signed)
Immediate Anesthesia Transfer of Care Note  Patient: Eugene Acevedo  Procedure(s) Performed: LEFT HERNIA REPAIR INGUINAL WITH MESH (Left Inguinal) INSERTION OF MESH (Left Inguinal)  Patient Location: PACU  Anesthesia Type:General  Level of Consciousness: awake, alert  and oriented  Airway & Oxygen Therapy: Patient Spontanous Breathing and Patient connected to face mask oxygen  Post-op Assessment: Report given to RN and Post -op Vital signs reviewed and stable  Post vital signs: Reviewed and stable  Last Vitals:  Vitals:   10/04/17 1509 10/04/17 1510  BP:  125/69  Pulse: 79   Resp: (!) 22 16  Temp:    SpO2: 100% 100%    Last Pain:  Vitals:   10/04/17 1252  TempSrc: Oral      Patients Stated Pain Goal: 4 (68/12/75 1700)  Complications: No apparent anesthesia complications

## 2017-10-04 NOTE — Assessment & Plan Note (Signed)
Controlled, no change in medication  

## 2017-10-04 NOTE — Progress Notes (Signed)
Assisted Dr. Oddono with left, ultrasound guided, transabdominal plane block. Side rails up, monitors on throughout procedure. See vital signs in flow sheet. Tolerated Procedure well. 

## 2017-10-04 NOTE — Assessment & Plan Note (Signed)
Controlled, no change in medication DASH diet and commitment to daily physical activity for a minimum of 30 minutes discussed and encouraged, as a part of hypertension management. The importance of attaining a healthy weight is also discussed.  BP/Weight 10/04/2017 09/29/2017 07/14/2017 04/25/2017 03/02/2017 02/16/2017 12/30/760  Systolic BP 263 335 456 256 389 - 373  Diastolic BP 82 75 428 74 55 - 72  Wt. (Lbs) 165 165.25 163 164 165 165 173.4  BMI 23.68 23.71 23.39 23.53 23.68 23.68 23.52

## 2017-10-04 NOTE — Assessment & Plan Note (Signed)
Needs repeat scan scheduled

## 2017-10-04 NOTE — Anesthesia Preprocedure Evaluation (Signed)
Anesthesia Evaluation  Patient identified by MRN, date of birth, ID band Patient awake    Reviewed: Allergy & Precautions, NPO status , Patient's Chart, lab work & pertinent test results  History of Anesthesia Complications (+) PROLONGED EMERGENCE and history of anesthetic complications  Airway Mallampati: II  TM Distance: >3 FB Neck ROM: Full    Dental no notable dental hx.    Pulmonary neg pulmonary ROS, former smoker,    Pulmonary exam normal breath sounds clear to auscultation       Cardiovascular hypertension, Pt. on medications negative cardio ROS Normal cardiovascular exam Rhythm:Regular Rate:Normal     Neuro/Psych PSYCHIATRIC DISORDERS Anxiety negative neurological ROS  negative psych ROS   GI/Hepatic negative GI ROS, Neg liver ROS, GERD  ,  Endo/Other  negative endocrine ROS  Renal/GU negative Renal ROS  negative genitourinary   Musculoskeletal negative musculoskeletal ROS (+) Arthritis , Osteoarthritis,    Abdominal   Peds negative pediatric ROS (+)  Hematology negative hematology ROS (+)   Anesthesia Other Findings   Reproductive/Obstetrics negative OB ROS                             Anesthesia Physical Anesthesia Plan  ASA: II  Anesthesia Plan: General   Post-op Pain Management: GA combined w/ Regional for post-op pain   Induction: Intravenous  PONV Risk Score and Plan: 1 and 2 and Ondansetron, Treatment may vary due to age or medical condition, Dexamethasone and Midazolam  Airway Management Planned: Oral ETT and LMA  Additional Equipment:   Intra-op Plan:   Post-operative Plan: Extubation in OR  Informed Consent: I have reviewed the patients History and Physical, chart, labs and discussed the procedure including the risks, benefits and alternatives for the proposed anesthesia with the patient or authorized representative who has indicated his/her understanding  and acceptance.     Plan Discussed with: CRNA and Anesthesiologist  Anesthesia Plan Comments: (  )        Anesthesia Quick Evaluation

## 2017-10-04 NOTE — Op Note (Signed)
10/04/2017  4:53 PM  PATIENT:  Samul Dada  62 y.o. male  Patient Care Team: Fayrene Helper, MD as PCP - General  PRE-OPERATIVE DIAGNOSIS:  Symptomatic left inguinal hernia  POST-OPERATIVE DIAGNOSIS:  Same  PROCEDURE:  Open left inguinal hernia repair with mesh  SURGEON:  Sharon Mt. Raigen Jagielski, MD  ASSISTANT: None  ANESTHESIA:  General endotracheal  COUNTS:  Sponge, needle and instrument counts were reported correct x2 at the conclusion of the operation.  EBL: 10cc  DRAINS: None  SPECIMEN: None  FINDINGS: Left indirect inguinal hernia + cord lipoma.  Standard Lichtenstein repair with Ultrapro mesh.  INDICATION: Mr. Rhine is a very pleasant 62 year old gentleman whom I saw in the office for left groin pain.  Initially when I saw him I palpated no defect or bulge on exam.  A CAT scan was obtained which was unremarkable and no inguinal hernias were visualized.  He subsequently returned to the office for follow-up at which time he had a small reducible bulge in his left groin. Given this finding he was diagnosed with asymptomatic left anal hernia. The anatomy and physiology of the inguinal canal, femoral canal and abdominal wall was described in detail using pictures/diagrams. The pathophysiology of inguinal and femoral hernias was also detailed. The procedure, material risks (including, but not limited to, pain, bleeding, infection, scarring, aspiration, need for blood transfusion, damage to surrounding structures, need for additional procedures, recurrence, hematoma, seroma, damage to viscus/bladder/testicle, ischemic orchitis, mesh complications, heart attack, stroke, death), benefits and alternatives were explained. Additionally, we discussed the use of mesh in this procedure. The likelihood of success in repairing the hernia and returning the patient to their previous functional status is good The patient's questions were answered to his satisfaction and he elected to proceed  with surgery.  DESCRIPTION: A left ilioinguinal nerve block was performed by our anesthetist in preop holding. The patient was identified in preop holding and taken to the OR where he was placed supine on the operating room table and SCDs were placed. General endotracheal anesthesia was induced without difficulty. The patient was then prepped and draped in the usual sterile fashion. A surgical timeout was performed indicating the correct patient, procedure, positioning and need for preoperative antibiotics.  The lower abdomen and groin was prepped with Chloraprep and draped in the standard fashion. An oblique incision was made. Dissection was carried down through the subcutaneous tissue with cautery to the external oblique fascia.  The external oblique fascia was opened along the direction of its fibers to the external ring.  The spermatic cord was circumferentially dissected bluntly at the level of the pubic tubercle and retracted with a Penrose drain after encircling all of the cord contents.  The ilioinguinal nerve was identified and preserved.  The floor of the inguinal canal was inspected.  The cord structures were all identified and then dissected. The hernia sac was identified, care being taken not to skeletonize the cord this was dissected free from the cord structures. The hernia was reduced and the sac empty. The empty sac was then carefully opened laterally at the distal end of the sac. High ligation of the sac was performed under direct visualization and the sac suture ligated with a 2-0 Vicryl suture.  The cord was further explored and found to contain a cord lipoma.  This was suture ligated and excised. A piece of light weight prolene mesh (Ethicon, Ultrapro) was cut to fit the inguinal floor and deployed. The mesh was secured to the  pubic tubercle using two 0 Ethibond sutures - ensuring that there was 2cm of overlap between the mesh and the pubic tubercle. The mesh was then secured using  additional 0 Ethibond suture to the shelving edge of the inguinal ligament inferiorly and the conjoined tendon superiorly. The mesh was tucked underneath the external oblique fascia laterally.  The tails of the mesh were then closed around the spermatic cord to recreate the internal inguinal ring and this ring was secured using 0 Ethibond suture.  The external oblique fascia was reapproximated with 2-0 Vicryl.  3-0 Vicryl was then used to close Scarpa's fascia. A running 4-0 Monocryl subcuticular suture was used to close the skin.  Dermabond was the applied. The patient was awakened from general anesthesia, extubated and taken to the recovery in satisfactory condition.  DISPOSITION: PACU in satisfactory condition  Sharon Mt. Dema Severin, M.D. General and [facility]  Note: This dictation was prepared with Dragon/digital dictation along with [employer]. Any transcriptional errors that result from this process are unintentional.

## 2017-10-04 NOTE — Anesthesia Procedure Notes (Deleted)
Procedures

## 2017-10-04 NOTE — Progress Notes (Signed)
   OVA MEEGAN     MRN: [id number]      DOB: [birth year]   HPI Eugene Acevedo is here for follow up and re-evaluation of chronic medical conditions, medication management and review of any available recent lab and radiology data.  Preventive health is updated, specifically  Cancer screening and Immunization.   Questions or concerns regarding consultations or procedures which the PT has had in the interim are  Addressed. Has left inguinal hernia repair scheduled for today The PT denies any adverse reactions to current medications since the last visit.  Drinks 6 beers per week   ROS Denies recent fever or chills. Denies sinus pressure, nasal congestion, ear pain or sore throat. Denies chest congestion, productive cough or wheezing. Denies chest pains, palpitations and leg swelling Denies abdominal pain, nausea, vomiting,diarrhea or constipation.   Denies dysuria, frequency, hesitancy or incontinence. Denies joint pain, swelling and limitation in mobility. Denies headaches, seizures, numbness, or tingling. Denies depression, anxiety or insomnia. Denies skin break down or rash.   PE  BP 120/82   Pulse 74   Resp 16   Ht 5\' 10"  (1.778 m)   Wt 165 lb (74.8 kg)   SpO2 96%   BMI 23.68 kg/m   Patient alert and oriented and in no cardiopulmonary distress.  HEENT: No facial asymmetry, EOMI,   oropharynx pink and moist.  Neck supple no JVD, no mass.  Chest: Clear to auscultation bilaterally.  CVS: S1, S2 no murmurs, no S3.Regular rate.  ABD: Soft non tender.   Ext: No edema  MS: Adequate ROM spine, shoulders, hips and knees.  Skin: Intact, no ulcerations or rash noted.  Psych: Good eye contact, normal affect. Memory intact not anxious or depressed appearing.  CNS: CN 2-12 intact, power,  normal throughout.no focal deficits noted.   Assessment & Plan  Essential hypertension Controlled, no change in medication DASH diet and commitment to daily physical activity for a  minimum of 30 minutes discussed and encouraged, as a part of hypertension management. The importance of attaining a healthy weight is also discussed.  BP/Weight 10/04/2017 09/29/2017 07/14/2017 04/25/2017 03/02/2017 02/16/2017 3/55/9741  Systolic BP 638 453 646 803 212 - 248  Diastolic BP 82 75 250 74 55 - 72  Wt. (Lbs) 165 165.25 163 164 165 165 173.4  BMI 23.68 23.71 23.39 23.53 23.68 23.68 23.52       Insomnia due to anxiety and fear Sleep hygiene reviewed and written information offered also. Prescription sent for  medication needed.   ED (erectile dysfunction) Controlled, no change in medication   History of smoking 30 or more pack years Needs repeat scan scheduled

## 2017-10-04 NOTE — Discharge Instructions (Signed)
POST OP INSTRUCTIONS  1. DIET: As tolerated. Follow a light bland diet the first 24 hours after arrival home, such as soup, liquids, crackers, etc.  Be sure to include lots of fluids daily.  Avoid fast food or heavy meals as your are more likely to get nauseated.  Eat a low fat the next few days after surgery.  2. Take your usually prescribed home medications unless otherwise directed.  3. PAIN CONTROL: a. Pain is best controlled by a usual combination of three different methods TOGETHER: i. Ice/Heat ii. Over the counter pain medication iii. Prescription pain medication b. Most patients will experience some swelling and bruising around the surgical site.  Ice packs or heating pads (30-60 minutes up to 6 times a day) will help. Some people prefer to use ice alone, heat alone, alternating between ice & heat.  Experiment to what works for you.  Swelling and bruising can take several weeks to resolve.   c. It is helpful to take an over-the-counter pain medication regularly for the first few weeks: i. Ibuprofen (Motrin/Advil) - 200mg  tabs - take 3 tabs (600mg ) every 6 hours as needed for pain ii. Acetaminophen (Tylenol) - you may take 650mg  every 6 hours as needed. You can take this with motrin as they act differently on the body. If you are taking a narcotic pain medication that has acetaminophen in it, do not take over the counter tylenol at the same time.  Iii. NOTE: You may take both of these medications together - most patients  find it most helpful when alternating between the two (i.e. Ibuprofen at 6am,  tylenol at 9am, ibuprofen at 12pm ...) d. A  prescription for pain medication should be given to you upon discharge.  Take your pain medication as prescribed if your pain is not adequatly controlled with the over-the-counter pain reliefs mentioned above.  4. Avoid getting constipated.  Between the surgery and the pain medications, it is common to experience some constipation.  Increasing fluid  intake and taking a fiber supplement (such as Metamucil, Citrucel, FiberCon, MiraLax, etc) 1-2 times a day regularly will usually help prevent this problem from occurring.  A mild laxative (prune juice, Milk of Magnesia, MiraLax, etc) should be taken according to package directions if there are no bowel movements after 48 hours.    5. Dressing: Your incision is covered in Dermabond which is like sterile superglue for the skin. This will come off on it's own in a couple weeks. It is waterproof and you may bathe normally starting the day after your surgery in a shower. Avoid baths/pools/lakes/oceans until your wounds have fully healed.  6. ACTIVITIES as tolerated:   a. Avoid heavy lifting (>10lbs or 1 gallon of milk) for the next 6 weeks. b. You may resume regular (light) daily activities beginning the next day--such as daily self-care, walking, climbing stairs--gradually increasing activities as tolerated.  If you can walk 30 minutes without difficulty, it is safe to try more intense activity such as jogging, treadmill, bicycling, low-impact aerobics.  c. DO NOT PUSH THROUGH PAIN.  Let pain be your guide: If it hurts to do something, don't do it. d. Dennis Bast may drive when you are no longer taking prescription pain medication, you can comfortably wear a seatbelt, and you can safely maneuver your car and apply brakes. e. Dennis Bast may have sexual intercourse when it is comfortable.   7. FOLLOW UP in our office a. Please call [facility] at [phone] to set up an appointment  to see your surgeon in the office for a follow-up appointment approximately 2 weeks after your surgery. °b. Make sure that you call for this appointment the day you arrive home to insure a convenient appointment time. ° °9. If you have disability or family leave forms that need to be completed, you may have them completed by your primary care physician's office; for return to work instructions, please ask our office staff and they will be happy to  assist you in obtaining this documentation °  °When to call us [phone]: °1. Poor pain control °2. Reactions / problems with new medications (rash/itching, etc)  °3. Fever over 101.5 F (38.5 C) °4. Inability to urinate °5. Nausea/vomiting °6. Worsening swelling or bruising °7. Continued bleeding from incision. °8. Increased pain, redness, or drainage from the incision ° °The clinic staff is available to answer your questions during regular business hours (8:30am-5pm).  Please don’t hesitate to call and ask to speak to one of our nurses for clinical concerns.   A surgeon from [facility] is always on call at the hospitals °  °If you have a medical emergency, go to the nearest emergency room or call 911. ° °[facility] °[address], [address], [place], [place]  [address] °MAIN: [phone] °FAX: [phone] °[email or web address] ° ° °

## 2017-10-04 NOTE — Assessment & Plan Note (Signed)
Sleep hygiene reviewed and written information offered also. Prescription sent for  medication needed.  

## 2017-10-04 NOTE — Anesthesia Procedure Notes (Signed)
Anesthesia Regional Block: TAP block   Pre-Anesthetic Checklist: ,, timeout performed, Correct Patient, Correct Site, Correct Laterality, Correct Procedure, Correct Position, site marked, Risks and benefits discussed,  Surgical consent,  Pre-op evaluation,  At surgeon's request and post-op pain management  Laterality: Left  Prep: chloraprep       Needles:  Injection technique: Single-shot  Needle Type: Echogenic Stimulator Needle     Needle Length: 5cm  Needle Gauge: 22     Additional Needles:   Procedures:, nerve stimulator,,, ultrasound used (permanent image in chart),,,,  Narrative:  Start time: 10/04/2017 3:01 PM End time: 10/04/2017 3:10 PM Injection made incrementally with aspirations every 5 mL.  Performed by: Personally  Anesthesiologist: Janeece Riggers, MD  Additional Notes: Functioning IV was confirmed and monitors were applied.  A 62mm 22ga Arrow echogenic stimulator needle was used. Sterile prep and drape,hand hygiene and sterile gloves were used. Ultrasound guidance: relevant anatomy identified, needle position confirmed, local anesthetic spread visualized around nerve(s)., vascular puncture avoided.  Image printed for medical record. Negative aspiration and negative test dose prior to incremental administration of local anesthetic. The patient tolerated the procedure well.

## 2017-10-04 NOTE — Anesthesia Procedure Notes (Signed)
Procedure Name: Intubation Date/Time: 10/04/2017 3:20 PM Performed by: [place] Jarome Trull C, CRNA Pre-anesthesia Checklist: Patient identified, Emergency Drugs available, Suction available and Patient being monitored Patient Re-evaluated:Patient Re-evaluated prior to induction Oxygen Delivery Method: Circle system utilized Preoxygenation: Pre-oxygenation with 100% oxygen Induction Type: IV induction, Rapid sequence and Cricoid Pressure applied Laryngoscope Size: Mac and 4 Grade View: Grade I Tube type: Oral Number of attempts: 1 Airway Equipment and Method: Stylet and Oral airway Placement Confirmation: ETT inserted through vocal cords under direct vision,  positive ETCO2 and breath sounds checked- equal and bilateral Tube secured with: Tape Dental Injury: Teeth and Oropharynx as per pre-operative assessment

## 2017-10-04 NOTE — Anesthesia Postprocedure Evaluation (Signed)
Anesthesia Post Note  Patient: LACEY DOTSON  Procedure(s) Performed: LEFT HERNIA REPAIR INGUINAL WITH MESH (Left Inguinal) INSERTION OF MESH (Left Inguinal)     Patient location during evaluation: PACU Anesthesia Type: General Level of consciousness: awake and alert Pain management: pain level controlled Vital Signs Assessment: post-procedure vital signs reviewed and stable Respiratory status: spontaneous breathing, nonlabored ventilation, respiratory function stable and patient connected to nasal cannula oxygen Cardiovascular status: blood pressure returned to baseline and stable Postop Assessment: no apparent nausea or vomiting Anesthetic complications: no    Last Vitals:  Vitals:   10/04/17 1723 10/04/17 1750  BP: 132/74 129/75  Pulse: 70 72  Resp: 16 16  Temp: 36.5 C   SpO2: 100% 99%    Last Pain:  Vitals:   10/04/17 1750  TempSrc: Oral   Pain Goal: Patients Stated Pain Goal: 4 (10/04/17 1419)               Deserea Bordley

## 2017-10-04 NOTE — Patient Instructions (Addendum)
Physical exam in 6 months, call if you need me sooner  Best wishes and continued good health , keep up great habits for 2019  Condolence on recent loss.  No medication change  Best with hernia repair today  You need fasting lipid, chem 7 , PSA and Vit d 1 week before next visit, we will send lanb order to the lab next door  You need a repeat chest scan to continue screening for lung cancer  the first week in February, this is being scheduled, we will call you with your appointment  Thank you  for choosing [facility]. We consider it a privelige to serve you.  Delivering excellent health care in a caring and  compassionate way is our goal.  Partnering with you,  so that together we can achieve this goal is our strategy.

## 2017-10-05 ENCOUNTER — Encounter (HOSPITAL_COMMUNITY): Payer: Self-pay | Admitting: Surgery

## 2017-10-05 NOTE — Addendum Note (Signed)
Addendum  created 10/05/17 1275 by Lollie Sails, CRNA   Charge Capture section accepted

## 2017-10-11 ENCOUNTER — Encounter (HOSPITAL_COMMUNITY): Payer: Self-pay | Admitting: Surgery

## 2017-11-23 ENCOUNTER — Ambulatory Visit (HOSPITAL_COMMUNITY)
Admission: RE | Admit: 2017-11-23 | Discharge: 2017-11-23 | Disposition: A | Discharge status: Home / Self Care | Payer: BLUE CROSS/BLUE SHIELD | Source: Ambulatory Visit | Attending: Family Medicine | Admitting: Family Medicine

## 2017-11-23 DIAGNOSIS — J439 Emphysema, unspecified: Secondary | ICD-10-CM | POA: Diagnosis not present

## 2017-11-23 DIAGNOSIS — Z87891 Personal history of nicotine dependence: Secondary | ICD-10-CM | POA: Insufficient documentation

## 2017-11-23 DIAGNOSIS — I2510 Atherosclerotic heart disease of native coronary artery without angina pectoris: Secondary | ICD-10-CM | POA: Insufficient documentation

## 2017-11-23 DIAGNOSIS — R918 Other nonspecific abnormal finding of lung field: Secondary | ICD-10-CM | POA: Diagnosis not present

## 2017-11-23 DIAGNOSIS — Z122 Encounter for screening for malignant neoplasm of respiratory organs: Secondary | ICD-10-CM | POA: Diagnosis present

## 2017-11-23 DIAGNOSIS — I700 Atherosclerosis of aorta: Secondary | ICD-10-CM | POA: Insufficient documentation

## 2018-04-04 ENCOUNTER — Other Ambulatory Visit: Payer: Self-pay

## 2018-04-04 ENCOUNTER — Ambulatory Visit: Payer: BLUE CROSS/BLUE SHIELD | Admitting: Family Medicine

## 2018-04-04 ENCOUNTER — Encounter: Payer: Self-pay | Admitting: Family Medicine

## 2018-04-04 VITALS — BP 116/74 | HR 70 | Ht 70.0 in | Wt 165.0 lb

## 2018-04-04 DIAGNOSIS — M159 Polyosteoarthritis, unspecified: Secondary | ICD-10-CM

## 2018-04-04 DIAGNOSIS — I10 Essential (primary) hypertension: Secondary | ICD-10-CM | POA: Diagnosis not present

## 2018-04-04 DIAGNOSIS — M8949 Other hypertrophic osteoarthropathy, multiple sites: Secondary | ICD-10-CM

## 2018-04-04 DIAGNOSIS — M150 Primary generalized (osteo)arthritis: Secondary | ICD-10-CM | POA: Diagnosis not present

## 2018-04-04 DIAGNOSIS — E785 Hyperlipidemia, unspecified: Secondary | ICD-10-CM | POA: Diagnosis not present

## 2018-04-04 DIAGNOSIS — M5441 Lumbago with sciatica, right side: Secondary | ICD-10-CM | POA: Diagnosis not present

## 2018-04-04 DIAGNOSIS — N529 Male erectile dysfunction, unspecified: Secondary | ICD-10-CM

## 2018-04-04 MED ORDER — GABAPENTIN 100 MG PO CAPS: 100.0000 mg | ORAL_CAPSULE | Freq: Every day | ORAL | 1 refills | Status: DC

## 2018-04-04 NOTE — Assessment & Plan Note (Signed)
Gabapentin 100mg  at bedtime prescribed, pt likely will not fill, needs surgical opinion asap

## 2018-04-04 NOTE — Assessment & Plan Note (Signed)
Controlled, no change in medication  

## 2018-04-04 NOTE — Patient Instructions (Addendum)
Annual physical exam January 25 or after, call if you need me soooner  Please get labs today  CBC, lipid, chem 7 and EGFr, PSA, tSH today   I am sending for yhe copy of your MRI report and will refer you to neurosurgeon and contact you with an appointment call you on your cell [phone]  Gabapentin one tablet at bedtime for pain  All the best!

## 2018-04-04 NOTE — Progress Notes (Signed)
   Eugene Acevedo     MRN: [id number]      DOB: Jan 03, 1956   HPI Eugene Acevedo is here for follow up and re-evaluation of chronic medical conditions, medication management and review of any available recent lab and radiology data.  Preventive health is updated, specifically  Cancer screening and Immunization.   About 6 weeks ago pt who is a Eugene Acevedo developed acute low back pain radiating down his right leg to the foot, pain is constant and disabling, never had this before, rated between an 8 to a 10. 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 He is here for an opinion as to best specialist to attend to the issue, as surgery has been offerred He has also been told of a neurosurgeon   ROS Denies recent fever or chills. Denies sinus pressure, nasal congestion, ear pain or sore throat. Denies chest congestion, productive cough or wheezing. Denies chest pains, palpitations and leg swelling Denies abdominal pain, nausea, vomiting,diarrhea or constipation.   Denies dysuria, frequency, hesitancy or incontinence. . Denies headaches, seizures, numbness, or tingling. Denies depression, anxiety or insomnia. Denies skin break down or rash.   PE  BP 116/74 (BP Location: Left Arm, Patient Position: Sitting, Cuff Size: Normal)   Pulse 70   Ht 5\' 10"  (1.778 m)   Wt 165 lb 0.6 oz (74.9 kg)   BMI 23.68 kg/m   Patient alert and oriented and in no cardiopulmonary distress.  HEENT: No facial asymmetry, EOMI,   oropharynx pink and moist.  Neck supple no JVD, no mass.  Chest: Clear to auscultation bilaterally.  CVS: S1, S2 no murmurs, no S3.Regular rate.  ABD: Soft non tender.   Ext: No edema  MS: decreased  ROM spine, adequate in  shoulders,  Decreased in right hip and knee due  to pain.  Skin: Intact, no ulcerations or rash noted.  Psych: Good eye contact, normal affect. Memory intact not anxious or depressed appearing.  CNS: CN 2-12 intact, power,  normal throughout.no focal deficits noted.   Assessment & Plan  Acute low back pain with right-sided sciatica Gabapentin 100mg  at bedtime prescribed, pt likely will not fill, needs surgical opinion asap  Essential hypertension Controlled, no change in medication

## 2018-04-05 LAB — BASIC METABOLIC PANEL WITH GFR
BUN: 16 mg/dL (ref 7–25)
CO2: 29 mmol/L (ref 20–32)
CREATININE: 0.96 mg/dL (ref 0.70–1.25)
Calcium: 9.3 mg/dL (ref 8.6–10.3)
Chloride: 104 mmol/L (ref 98–110)
GFR, EST AFRICAN AMERICAN: 98 mL/min/{1.73_m2} (ref >=60)
GFR, EST NON AFRICAN AMERICAN: 84 mL/min/{1.73_m2} (ref >=60)
Glucose, Bld: 104 mg/dL — ABNORMAL HIGH (ref 65–99)
Potassium: 4.6 mmol/L (ref 3.5–5.3)
SODIUM: 140 mmol/L (ref 135–146)

## 2018-04-05 LAB — LIPID PANEL
CHOLESTEROL: 202 mg/dL — AB (ref <200)
HDL: 57 mg/dL (ref >40)
LDL CHOLESTEROL (CALC): 127 mg/dL — AB
Non-HDL Cholesterol (Calc): 145 mg/dL (calc) — ABNORMAL HIGH (ref <130)
Total CHOL/HDL Ratio: 3.5 (calc) (ref <5.0)
Triglycerides: 79 mg/dL (ref <150)

## 2018-04-05 LAB — VITAMIN D 25 HYDROXY (VIT D DEFICIENCY, FRACTURES): Vit D, 25-Hydroxy: 36 ng/mL (ref 30–100)

## 2018-04-05 LAB — PSA: PSA: 0.5 ng/mL (ref <=4.0)

## 2018-04-29 ENCOUNTER — Other Ambulatory Visit: Payer: Self-pay | Admitting: Family Medicine

## 2018-05-01 ENCOUNTER — Other Ambulatory Visit: Payer: Self-pay | Admitting: Family Medicine

## 2018-05-13 ENCOUNTER — Other Ambulatory Visit: Payer: Self-pay | Admitting: Family Medicine

## 2018-05-13 DIAGNOSIS — M5441 Lumbago with sciatica, right side: Secondary | ICD-10-CM

## 2018-07-03 ENCOUNTER — Other Ambulatory Visit: Payer: Self-pay | Admitting: Family Medicine

## 2018-07-08 ENCOUNTER — Other Ambulatory Visit: Payer: Self-pay | Admitting: Family Medicine

## 2018-07-28 ENCOUNTER — Other Ambulatory Visit: Payer: Self-pay | Admitting: Family Medicine

## 2018-10-31 ENCOUNTER — Encounter: Payer: BLUE CROSS/BLUE SHIELD | Admitting: Family Medicine

## 2018-11-12 ENCOUNTER — Ambulatory Visit (INDEPENDENT_AMBULATORY_CARE_PROVIDER_SITE_OTHER): Payer: BLUE CROSS/BLUE SHIELD | Admitting: Family Medicine

## 2018-11-12 ENCOUNTER — Encounter: Payer: Self-pay | Admitting: Family Medicine

## 2018-11-12 VITALS — BP 128/80 | HR 94 | Resp 14 | Ht 70.0 in | Wt 168.0 lb

## 2018-11-12 DIAGNOSIS — Z87891 Personal history of nicotine dependence: Secondary | ICD-10-CM

## 2018-11-12 DIAGNOSIS — I10 Essential (primary) hypertension: Secondary | ICD-10-CM

## 2018-11-12 DIAGNOSIS — F5105 Insomnia due to other mental disorder: Secondary | ICD-10-CM | POA: Diagnosis not present

## 2018-11-12 DIAGNOSIS — N529 Male erectile dysfunction, unspecified: Secondary | ICD-10-CM

## 2018-11-12 DIAGNOSIS — F411 Generalized anxiety disorder: Secondary | ICD-10-CM

## 2018-11-12 DIAGNOSIS — Z0000 Encounter for general adult medical examination without abnormal findings: Secondary | ICD-10-CM | POA: Diagnosis not present

## 2018-11-12 DIAGNOSIS — E785 Hyperlipidemia, unspecified: Secondary | ICD-10-CM

## 2018-11-12 DIAGNOSIS — F409 Phobic anxiety disorder, unspecified: Secondary | ICD-10-CM

## 2018-11-12 MED ORDER — SILDENAFIL CITRATE 100 MG PO TABS: 100.0000 mg | ORAL_TABLET | Freq: Every day | ORAL | 3 refills | Status: DC | PRN

## 2018-11-12 MED ORDER — PAROXETINE HCL 10 MG PO TABS: 10.0000 mg | ORAL_TABLET | Freq: Every day | ORAL | 5 refills | Status: DC

## 2018-11-12 MED ORDER — ALPRAZOLAM 1 MG PO TABS: ORAL_TABLET | ORAL | 1 refills | Status: DC

## 2018-11-12 NOTE — Assessment & Plan Note (Signed)
Controlled, no change in medication DASH diet and commitment to daily physical activity for a minimum of 30 minutes discussed and encouraged, as a part of hypertension management. The importance of attaining a healthy weight is also discussed.  BP/Weight 11/12/2018 04/04/2018 10/04/2017 10/04/2017 09/29/2017 07/14/2017 0/38/3338  Systolic BP 329 191 660 600 459 977 414  Diastolic BP 80 74 82 75 75 103 74  Wt. (Lbs) 168 165.04 165 165 165.25 163 164  BMI 24.11 23.68 23.68 24.37 23.71 23.39 23.53

## 2018-11-12 NOTE — Assessment & Plan Note (Signed)
Has responded well to paxil 10 mg daily in the past, resume same

## 2018-11-12 NOTE — Assessment & Plan Note (Signed)
Relies on viagra for sexual performance , prescription sent in

## 2018-11-12 NOTE — Assessment & Plan Note (Signed)
Hyperlipidemia:Low fat diet discussed and encouraged.   Lipid Panel  Lab Results  Component Value Date   CHOL 202 (H) 04/04/2018   HDL 57 04/04/2018   LDLCALC 127 (H) 04/04/2018   TRIG 79 04/04/2018   CHOLHDL 3.5 04/04/2018   Updated lab needed , uncontrolled.

## 2018-11-12 NOTE — Assessment & Plan Note (Signed)
Needs low dose chest scan , will order

## 2018-11-12 NOTE — Assessment & Plan Note (Signed)
Reduce xanax to 2 tabs / week max, and pt re educated re need to reduce beer intake

## 2018-11-12 NOTE — Patient Instructions (Signed)
F/U in 6 months, call if you need me before   Labs today cmp and EGFR, TSH , lipid  Please cut back on beer , this will protect your liver  New for anxiety is paxil every day ] No more than THREE xanax tabs/ week  Keep workout going, increase number of days, and learn to turn off work  Thank you  for choosing [facility]. We consider it a privelige to serve you.  Delivering excellent health care in a caring and  compassionate way is our goal.  Partnering with you,  so that together we can achieve this goal is our strategy.

## 2018-11-12 NOTE — Progress Notes (Signed)
   Eugene Acevedo     MRN: [id number]      DOB: [birth year]   HPI: Patient is in for annual physical exam. . Immunization is reviewed , he will check on coverage for shingrix before getting this   PE; BP 128/80   Pulse 94   Resp 14   Ht 5\' 10"  (1.778 m)   Wt 168 lb (76.2 kg)   SpO2 94% Comment: room air  BMI 24.11 kg/m    Pleasant male, alert and oriented x 3, in no cardio-pulmonary distress. Afebrile. HEENT No facial trauma or asymetry. Sinuses non tender. EOMI, pupils equally reactive to light. External ears normal, tympanic membranes clear. Oropharynx moist, no exudate. Neck: supple, no adenopathy,JVD or thyromegaly.No bruits.  Chest: Clear to ascultation bilaterally.No crackles or wheezes. Non tender to palpation  Breast: No asymetry,no masses. No nipple discharge or inversion. No axillary or supraclavicular adenopathy  Cardiovascular system; Heart sounds normal,  S1 and  S2 ,no S3.  No murmur, or thrill. Apical beat not displaced Peripheral pulses normal.  Abdomen: Soft, non tender, no organomegaly or masses. No bruits. Bowel sounds normal. No guarding, tenderness or rebound.    Musculoskeletal exam: Full ROM of spine, hips , shoulders and knees. No deformity ,swelling or crepitus noted. No muscle wasting or atrophy.   Neurologic: Cranial nerves 2 to 12 intact. Power, tone ,sensation and reflexes normal throughout. No disturbance in gait. No tremor.  Skin: Intact, no ulceration, erythema , scaling or rash noted. Pigmentation normal throughout  Psych; Normal mood and slightly anxious. Judgement and concentration normal   Assessment & Plan:   Annual physical exam Annual exam as documented. Counseling done  re healthy lifestyle involving commitment to 150 minutes exercise per week, heart healthy diet, and attaining healthy weight.The importance of adequate sleep also discussed. Changes in health habits are decided on by the patient with goals  and time frames  set for achieving them. Immunization and cancer screening needs are specifically addressed at this visit.   Essential hypertension Controlled, no change in medication DASH diet and commitment to daily physical activity for a minimum of 30 minutes discussed and encouraged, as a part of hypertension management. The importance of attaining a healthy weight is also discussed.  BP/Weight 11/12/2018 04/04/2018 10/04/2017 10/04/2017 09/29/2017 07/14/2017 6/62/9476  Systolic BP 546 503 546 568 127 517 001  Diastolic BP 80 74 82 75 75 103 74  Wt. (Lbs) 168 165.04 165 165 165.25 163 164  BMI 24.11 23.68 23.68 24.37 23.71 23.39 23.53       Insomnia due to anxiety and fear Reduce xanax to 2 tabs / week max, and pt re educated re need to reduce beer intake  GAD (generalized anxiety disorder) Has responded well to paxil 10 mg daily in the past, resume same  Hyperlipidemia LDL goal <100 Hyperlipidemia:Low fat diet discussed and encouraged.   Lipid Panel  Lab Results  Component Value Date   CHOL 202 (H) 04/04/2018   HDL 57 04/04/2018   LDLCALC 127 (H) 04/04/2018   TRIG 79 04/04/2018   CHOLHDL 3.5 04/04/2018   Updated lab needed , uncontrolled.     History of smoking 30 or more pack years Needs low dose chest scan , will order  ED (erectile dysfunction) Relies on viagra for sexual performance , prescription sent in  '

## 2018-11-12 NOTE — Assessment & Plan Note (Signed)
Annual exam as documented. Counseling done  re healthy lifestyle involving commitment to 150 minutes exercise per week, heart healthy diet, and attaining healthy weight.The importance of adequate sleep also discussed. Changes in health habits are decided on by the patient with goals and time frames  set for achieving them. Immunization and cancer screening needs are specifically addressed at this visit. 

## 2018-11-13 LAB — COMPLETE METABOLIC PANEL WITH GFR
AG Ratio: 1.7 (calc) (ref 1.0–2.5)
ALKALINE PHOSPHATASE (APISO): 86 U/L (ref 35–144)
ALT: 8 U/L — AB (ref 9–46)
AST: 13 U/L (ref 10–35)
Albumin: 4.6 g/dL (ref 3.6–5.1)
BILIRUBIN TOTAL: 0.5 mg/dL (ref 0.2–1.2)
BUN: 12 mg/dL (ref 7–25)
CHLORIDE: 105 mmol/L (ref 98–110)
CO2: 27 mmol/L (ref 20–32)
Calcium: 9.3 mg/dL (ref 8.6–10.3)
Creat: 0.9 mg/dL (ref 0.70–1.25)
GFR, Est African American: 105 mL/min/{1.73_m2} (ref >=60)
GFR, Est Non African American: 91 mL/min/{1.73_m2} (ref >=60)
Globulin: 2.7 g/dL (calc) (ref 1.9–3.7)
Glucose, Bld: 89 mg/dL (ref 65–99)
Potassium: 4.3 mmol/L (ref 3.5–5.3)
Sodium: 141 mmol/L (ref 135–146)
Total Protein: 7.3 g/dL (ref 6.1–8.1)

## 2018-11-13 LAB — LIPID PANEL
CHOLESTEROL: 214 mg/dL — AB (ref <200)
HDL: 66 mg/dL (ref >=40)
LDL CHOLESTEROL (CALC): 131 mg/dL — AB
Non-HDL Cholesterol (Calc): 148 mg/dL (calc) — ABNORMAL HIGH (ref <130)
Total CHOL/HDL Ratio: 3.2 (calc) (ref <5.0)
Triglycerides: 73 mg/dL (ref <150)

## 2018-11-13 LAB — TSH: TSH: 1.23 mIU/L (ref 0.40–4.50)

## 2018-11-18 ENCOUNTER — Other Ambulatory Visit: Payer: Self-pay | Admitting: Family Medicine

## 2018-11-18 DIAGNOSIS — M5441 Lumbago with sciatica, right side: Secondary | ICD-10-CM

## 2018-11-28 ENCOUNTER — Ambulatory Visit (HOSPITAL_COMMUNITY)
Admission: RE | Admit: 2018-11-28 | Discharge: 2018-11-28 | Disposition: A | Discharge status: Home / Self Care | Payer: BLUE CROSS/BLUE SHIELD | Source: Ambulatory Visit | Attending: Family Medicine | Admitting: Family Medicine

## 2018-11-28 DIAGNOSIS — Z87891 Personal history of nicotine dependence: Secondary | ICD-10-CM | POA: Diagnosis present

## 2018-12-29 ENCOUNTER — Other Ambulatory Visit: Payer: Self-pay | Admitting: Family Medicine

## 2019-05-10 ENCOUNTER — Other Ambulatory Visit: Payer: Self-pay | Admitting: Family Medicine

## 2019-05-10 DIAGNOSIS — M5441 Lumbago with sciatica, right side: Secondary | ICD-10-CM

## 2019-05-14 ENCOUNTER — Encounter: Payer: Self-pay | Admitting: Family Medicine

## 2019-05-14 ENCOUNTER — Other Ambulatory Visit: Payer: Self-pay

## 2019-05-14 ENCOUNTER — Ambulatory Visit (INDEPENDENT_AMBULATORY_CARE_PROVIDER_SITE_OTHER): Payer: BLUE CROSS/BLUE SHIELD | Admitting: Family Medicine

## 2019-05-14 VITALS — BP 118/70 | HR 64 | Temp 98.2°F | Resp 15 | Ht 70.0 in | Wt 165.0 lb

## 2019-05-14 DIAGNOSIS — N529 Male erectile dysfunction, unspecified: Secondary | ICD-10-CM | POA: Diagnosis not present

## 2019-05-14 DIAGNOSIS — F411 Generalized anxiety disorder: Secondary | ICD-10-CM

## 2019-05-14 DIAGNOSIS — E785 Hyperlipidemia, unspecified: Secondary | ICD-10-CM | POA: Diagnosis not present

## 2019-05-14 DIAGNOSIS — Z125 Encounter for screening for malignant neoplasm of prostate: Secondary | ICD-10-CM

## 2019-05-14 DIAGNOSIS — Z87891 Personal history of nicotine dependence: Secondary | ICD-10-CM

## 2019-05-14 DIAGNOSIS — R918 Other nonspecific abnormal finding of lung field: Secondary | ICD-10-CM

## 2019-05-14 NOTE — Assessment & Plan Note (Signed)
Controlled, no change in medication  

## 2019-05-14 NOTE — Assessment & Plan Note (Signed)
Report in 2020,  Benign behavior

## 2019-05-14 NOTE — Assessment & Plan Note (Signed)
Hyperlipidemia:Low fat diet discussed and encouraged.   Lipid Panel  Lab Results  Component Value Date   CHOL 214 (H) 11/12/2018   HDL 66 11/12/2018   LDLCALC 131 (H) 11/12/2018   TRIG 73 11/12/2018   CHOLHDL 3.2 11/12/2018   Updated lab needed at/ before next visit.

## 2019-05-14 NOTE — Assessment & Plan Note (Signed)
rept scan in 11/2019, has quit smoking

## 2019-05-14 NOTE — Patient Instructions (Signed)
Annual physical exam with MD , Feb 15 or after  Keep up great health habits, PLEASE rteconsider vaccines   Thanks for choosing [facility], we consider it a privelige to serve you.   PSA today  Thanks for choosing [facility], we consider it a privelige to serve you.

## 2019-05-14 NOTE — Progress Notes (Signed)
   Eugene Acevedo     MRN: [id number]      DOB: 1956-07-08   HPI Eugene Acevedo is here for follow up and re-evaluation of chronic medical conditions, medication management and review of any available recent lab and radiology data.  Preventive health is updated, specifically  Cancer screening and Immunization.   Questions or concerns regarding consultations or procedures which the PT has had in the interim are  addressed. The PT denies any adverse reactions to current medications since the last visit.  There are no new concerns.  There are no specific complaints   ROS Denies recent fever or chills. Denies sinus pressure, nasal congestion, ear pain or sore throat. Denies chest congestion, productive cough or wheezing. Denies chest pains, palpitations and leg swelling Denies abdominal pain, nausea, vomiting,diarrhea or constipation.   Denies dysuria, frequency, hesitancy or incontinence. Denies  Uncontrolled joint pain, swelling and limitation in mobility. Denies headaches, seizures, numbness, or tingling. Denies depression, anxiety or insomnia. Denies skin break down or rash.   PE  BP 118/70   Pulse 64   Temp 98.2 F (36.8 C) (Temporal)   Resp 15   Ht 5\' 10"  (1.778 m)   Wt 165 lb (74.8 kg)   SpO2 98%   BMI 23.68 kg/m   Patient alert and oriented and in no cardiopulmonary distress.  HEENT: No facial asymmetry, EOMI,   oropharynx pink and moist.  Neck supple no JVD, no mass.  Chest: Clear to auscultation bilaterally.  CVS: S1, S2 no murmurs, no S3.Regular rate.  ABD: Soft non tender.   Ext: No edema  MS: Adequate ROM spine, shoulders, hips and knees.  Skin: Intact, no ulcerations or rash noted.  Psych: Good eye contact, normal affect. Memory intact not anxious or depressed appearing.  CNS: CN 2-12 intact, power,  normal throughout.no focal deficits noted.   Assessment & Plan  GAD (generalized anxiety disorder) Controlled, no change in medication    Hyperlipidemia LDL goal <100 Hyperlipidemia:Low fat diet discussed and encouraged.   Lipid Panel  Lab Results  Component Value Date   CHOL 214 (H) 11/12/2018   HDL 66 11/12/2018   LDLCALC 131 (H) 11/12/2018   TRIG 73 11/12/2018   CHOLHDL 3.2 11/12/2018   Updated lab needed at/ before next visit.     ED (erectile dysfunction) Controlled, no change in medication   History of smoking 30 or more pack years rept scan in 11/2019, has quit smoking  Multiple lung nodules Report in 2020,  Benign behavior

## 2019-05-15 LAB — PSA: PSA: 0.5 ng/mL (ref <=4.0)

## 2019-05-16 ENCOUNTER — Encounter: Payer: Self-pay | Admitting: Family Medicine

## 2019-05-21 NOTE — Addendum Note (Signed)
Addended by: Eual Fines on: 05/21/2019 04:33 PM   Modules accepted: Orders

## 2019-05-23 NOTE — Progress Notes (Signed)
psa cancelled

## 2019-07-11 ENCOUNTER — Other Ambulatory Visit: Payer: Self-pay | Admitting: Family Medicine

## 2019-07-11 DIAGNOSIS — M5441 Lumbago with sciatica, right side: Secondary | ICD-10-CM

## 2019-08-05 ENCOUNTER — Other Ambulatory Visit: Payer: Self-pay

## 2019-08-05 MED ORDER — PAROXETINE HCL 10 MG PO TABS: 10.0000 mg | ORAL_TABLET | Freq: Every day | ORAL | 5 refills | Status: DC

## 2019-08-09 ENCOUNTER — Other Ambulatory Visit: Payer: Self-pay

## 2019-08-20 ENCOUNTER — Other Ambulatory Visit: Payer: Self-pay

## 2019-08-20 MED ORDER — ALPRAZOLAM 1 MG PO TABS: ORAL_TABLET | ORAL | 2 refills | Status: DC

## 2019-09-09 ENCOUNTER — Other Ambulatory Visit: Payer: Self-pay | Admitting: Family Medicine

## 2019-09-09 DIAGNOSIS — M5441 Lumbago with sciatica, right side: Secondary | ICD-10-CM

## 2019-11-06 ENCOUNTER — Other Ambulatory Visit: Payer: Self-pay | Admitting: Family Medicine

## 2019-11-06 ENCOUNTER — Other Ambulatory Visit: Payer: Self-pay

## 2019-11-06 DIAGNOSIS — M5441 Lumbago with sciatica, right side: Secondary | ICD-10-CM

## 2019-11-06 MED ORDER — GABAPENTIN 100 MG PO CAPS: ORAL_CAPSULE | ORAL | 1 refills | Status: DC

## 2019-11-19 ENCOUNTER — Encounter: Payer: BLUE CROSS/BLUE SHIELD | Admitting: Family Medicine

## 2019-12-02 ENCOUNTER — Encounter: Payer: BLUE CROSS/BLUE SHIELD | Admitting: Family Medicine

## 2019-12-04 ENCOUNTER — Other Ambulatory Visit: Payer: Self-pay | Admitting: *Deleted

## 2019-12-04 MED ORDER — ALPRAZOLAM 1 MG PO TABS: ORAL_TABLET | ORAL | 2 refills | Status: DC

## 2019-12-06 ENCOUNTER — Encounter: Payer: BLUE CROSS/BLUE SHIELD | Admitting: Family Medicine

## 2020-01-23 ENCOUNTER — Encounter: Payer: BLUE CROSS/BLUE SHIELD | Admitting: Family Medicine

## 2020-03-05 ENCOUNTER — Other Ambulatory Visit: Payer: Self-pay | Admitting: Family Medicine

## 2020-03-05 DIAGNOSIS — M5441 Lumbago with sciatica, right side: Secondary | ICD-10-CM

## 2020-03-09 ENCOUNTER — Other Ambulatory Visit: Payer: Self-pay | Admitting: *Deleted

## 2020-03-09 MED ORDER — SILDENAFIL CITRATE 100 MG PO TABS: 100.0000 mg | ORAL_TABLET | Freq: Every day | ORAL | 3 refills | Status: DC | PRN

## 2020-03-26 ENCOUNTER — Encounter: Payer: BLUE CROSS/BLUE SHIELD | Admitting: Family Medicine

## 2020-03-31 ENCOUNTER — Ambulatory Visit (INDEPENDENT_AMBULATORY_CARE_PROVIDER_SITE_OTHER): Payer: BLUE CROSS/BLUE SHIELD | Admitting: Family Medicine

## 2020-03-31 ENCOUNTER — Other Ambulatory Visit: Payer: Self-pay

## 2020-03-31 ENCOUNTER — Encounter: Payer: Self-pay | Admitting: Family Medicine

## 2020-03-31 VITALS — BP 133/82 | HR 64 | Ht 70.0 in | Wt 164.0 lb

## 2020-03-31 DIAGNOSIS — Z125 Encounter for screening for malignant neoplasm of prostate: Secondary | ICD-10-CM | POA: Diagnosis not present

## 2020-03-31 DIAGNOSIS — E785 Hyperlipidemia, unspecified: Secondary | ICD-10-CM | POA: Diagnosis not present

## 2020-03-31 DIAGNOSIS — E559 Vitamin D deficiency, unspecified: Secondary | ICD-10-CM | POA: Diagnosis not present

## 2020-03-31 DIAGNOSIS — Z0000 Encounter for general adult medical examination without abnormal findings: Secondary | ICD-10-CM

## 2020-03-31 MED ORDER — ALPRAZOLAM 1 MG PO TABS: ORAL_TABLET | ORAL | 0 refills | Status: DC

## 2020-03-31 MED ORDER — PAROXETINE HCL 10 MG PO TABS: 10.0000 mg | ORAL_TABLET | Freq: Every day | ORAL | 5 refills | Status: DC

## 2020-03-31 NOTE — Patient Instructions (Signed)
Follow-up in office with MD in 4-1/2 months call if you need me sooner.  You need to commit to taking Paxil and your blood pressure medication every day.  Goal is to taper off of xanax.  Please get fasting labs including PSA August 13 or after.cBC, lipid, cmp and EGFr, PSA, tSH and vit D  Please reconsider and get the Covid vaccines.  If you decide to get the shingles vaccine please call we have the vaccine in the office.  It is important that you exercise regularly at least 30 minutes 5 times a week. If you develop chest pain, have severe difficulty breathing, or feel very tired, stop exercising immediately and seek medical attention  Think about what you will eat, plan ahead. Choose " clean, green, fresh or frozen" over canned, processed or packaged foods which are more sugary, salty and fatty. 70 to 75% of food eaten should be vegetables and fruit. Three meals at set times with snacks allowed between meals, but they must be fruit or vegetables. Aim to eat over a 12 hour period , example 7 am to 7 pm, and STOP after  your last meal of the day. Drink water,generally about 64 ounces per day, no other drink is as healthy. Fruit juice is best enjoyed in a healthy way, by EATING the fruit. Thanks for choosing [facility], we consider it a privelige to serve you.

## 2020-04-01 ENCOUNTER — Encounter: Payer: Self-pay | Admitting: Family Medicine

## 2020-04-01 NOTE — Assessment & Plan Note (Signed)
Annual exam as documented. Counseling done  re healthy lifestyle involving commitment to 150 minutes exercise per week, heart healthy diet, and attaining healthy weight.The importance of adequate sleep also discussed. Changes in health habits are decided on by the patient with goals and time frames  set for achieving them. Immunization and cancer screening needs are specifically addressed at this visit. 

## 2020-04-01 NOTE — Progress Notes (Signed)
   Eugene Acevedo     MRN: [id number]      DOB: 17-Mar-1956   HPI: Patient is in for annual physical exam. No other health concerns are expressed or addressed at the visit. Recent labs, if available are reviewed. Immunization is reviewed , and  updated if needed.    PE; BP 133/82   Pulse 64   Ht 5\' 10"  (1.778 m)   Wt 164 lb (74.4 kg)   SpO2 96%   BMI 23.53 kg/m   Pleasant male, alert and oriented x 3, in no cardio-pulmonary distress. Afebrile. HEENT No facial trauma or asymetry. Sinuses non tender. EOMI External ears normal,  Neck: supple, no adenopathy,JVD or thyromegaly.No bruits.  Chest: Clear to ascultation bilaterally.No crackles or wheezes. Non tender to palpation  Cardiovascular system; Heart sounds normal,  S1 and  S2 ,no S3.  No murmur, or thrill. Apical beat not displaced Peripheral pulses normal.  Abdomen: Soft, non tender, no organomegaly or masses. No bruits. Bowel sounds normal. No guarding, tenderness or rebound.    Musculoskeletal exam: Full ROM of spine, hips , shoulders and knees. No deformity ,swelling or crepitus noted. No muscle wasting or atrophy.   Neurologic: Cranial nerves 2 to 12 intact. Power, tone ,sensation and reflexes normal throughout. No disturbance in gait. No tremor.  Skin: Intact, no ulceration, erythema , scaling or rash noted. Pigmentation normal throughout  Psych; Normal mood and affect. Judgement and concentration normal   Assessment & Plan:  Annual physical exam Annual exam as documented. Counseling done  re healthy lifestyle involving commitment to 150 minutes exercise per week, heart healthy diet, and attaining healthy weight.The importance of adequate sleep also discussed. Changes in health habits are decided on by the patient with goals and time frames  set for achieving them. Immunization and cancer screening needs are specifically addressed at this visit.

## 2020-05-04 ENCOUNTER — Other Ambulatory Visit: Payer: Self-pay | Admitting: Family Medicine

## 2020-05-04 DIAGNOSIS — M5441 Lumbago with sciatica, right side: Secondary | ICD-10-CM

## 2020-06-12 ENCOUNTER — Other Ambulatory Visit: Payer: Self-pay | Admitting: Family Medicine

## 2020-06-17 ENCOUNTER — Other Ambulatory Visit: Payer: Self-pay | Admitting: Family Medicine

## 2020-06-17 MED ORDER — ALPRAZOLAM 1 MG PO TABS: ORAL_TABLET | ORAL | 0 refills | Status: DC

## 2020-08-05 ENCOUNTER — Other Ambulatory Visit: Payer: Self-pay

## 2020-08-05 ENCOUNTER — Telehealth (INDEPENDENT_AMBULATORY_CARE_PROVIDER_SITE_OTHER): Payer: BLUE CROSS/BLUE SHIELD | Admitting: Family Medicine

## 2020-08-05 VITALS — BP 119/82 | Ht 70.0 in | Wt 163.0 lb

## 2020-08-05 DIAGNOSIS — E785 Hyperlipidemia, unspecified: Secondary | ICD-10-CM

## 2020-08-05 DIAGNOSIS — Z87891 Personal history of nicotine dependence: Secondary | ICD-10-CM

## 2020-08-05 DIAGNOSIS — I10 Essential (primary) hypertension: Secondary | ICD-10-CM

## 2020-08-05 DIAGNOSIS — F411 Generalized anxiety disorder: Secondary | ICD-10-CM

## 2020-08-05 DIAGNOSIS — R918 Other nonspecific abnormal finding of lung field: Secondary | ICD-10-CM

## 2020-08-05 NOTE — Patient Instructions (Addendum)
Annual physical exam with MD June 30 or after call if you need me before  Call in May for labs needed for that visit  Thankful you are doing well  Please reconsider and come in for your flu and pneumonia vaccines  You will be referred for and abdominal ultrasound to screen for aortic aneurysm since  You have been a smoker anr 64 years old  It is important that you exercise regularly at least 30 minutes 5 times a week. If you develop chest pain, have severe difficulty breathing, or feel very tired, stop exercising immediately and seek medical attention    Think about what you will eat, plan ahead. Choose " clean, green, fresh or frozen" over canned, processed or packaged foods which are more sugary, salty and fatty. 70 to 75% of food eaten should be vegetables and fruit. Three meals at set times with snacks allowed between meals, but they must be fruit or vegetables. Aim to eat over a 12 hour period , example 7 am to 7 pm, and STOP after  your last meal of the day. Drink water,generally about 64 ounces per day, no other drink is as healthy. Fruit juice is best enjoyed in a healthy way, by EATING the fruit.  I will  locate both your recent labs and chest scan

## 2020-08-06 ENCOUNTER — Other Ambulatory Visit: Payer: Self-pay | Admitting: Family Medicine

## 2020-08-06 ENCOUNTER — Encounter: Payer: Self-pay | Admitting: Family Medicine

## 2020-08-06 DIAGNOSIS — M5441 Lumbago with sciatica, right side: Secondary | ICD-10-CM

## 2020-08-06 NOTE — Assessment & Plan Note (Signed)
Reviewed labs from 07/2020 well controlled, no change in medication

## 2020-08-06 NOTE — Assessment & Plan Note (Signed)
Needs AAA screen

## 2020-08-06 NOTE — Progress Notes (Signed)
Virtual Visit via Telephone Note  I connected with Samul Dada on 08/06/20 at  2:40 PM EDT by telephone and verified that I am speaking with the correct person using two identifiers.  Location: Patient:home Provider: work from home   I discussed the limitations, risks, security and privacy concerns of performing an evaluation and management service by telephone and the availability of in person appointments. I also discussed with the patient that there may be a patient responsible charge related to this service. The patient expressed understanding and agreed to proceed.   History of Present Illness: F/U chronic problems Patient encouraged to get vaccines indicated, refusing flu pneumonia and covid vaccines at thesis time No concerns, needs AAA screening due to smoking history and agrees to this. Reports he has had an annual chest scan through another provider this year Denies recent fever or chills. Denies sinus pressure, nasal congestion, ear pain or sore throat. Denies chest congestion, productive cough or wheezing. Denies chest pains, palpitations and leg swelling Denies abdominal pain, nausea, vomiting,diarrhea or constipation.   Denies dysuria, frequency, hesitancy or incontinence. Denies uncontrolled  joint pain, swelling and limitation in mobility. Denies headaches, seizures, numbness, or tingling. Denies depression, anxiety or insomnia. Denies skin break down or rash.       Observations/Objective: Good communication with no confusion and intact memory. Alert and oriented x 3 No signs of respiratory distress during speech    Assessment and Plan: Multiple lung nodules CT scan of 02/2020 at [facility], multiple nodules , repeat in 1 year  Hyperlipidemia LDL goal <100 Reviewed labs from 07/2020 well controlled, no change in medication  GAD (generalized anxiety disorder) Controlled, no change in medication, daily paxil   History of smoking 30 or more pack  years Needs AAA screen  Essential hypertension DASH diet and commitment to daily physical activity for a minimum of 30 minutes discussed and encouraged, as a part of hypertension management. The importance of attaining a healthy weight is also discussed.  BP/Weight 08/05/2020 03/31/2020 05/14/2019 11/12/2018 04/04/2018 01/09/7025 12/08/8586  Systolic BP 502 774 128 786 767 209 470  Diastolic BP 82 82 70 80 74 82 75  Wt. (Lbs) 163 164 165 168 165.04 165 165  BMI 23.39 23.53 23.68 24.11 23.68 23.68 24.37         Follow Up Instructions:    I discussed the assessment and treatment plan with the patient. The patient was provided an opportunity to ask questions and all were answered. The patient agreed with the plan and demonstrated an understanding of the instructions.   The patient was advised to call back or seek an in-person evaluation if the symptoms worsen or if the condition fails to improve as anticipated.  I provided 12 minutes of non-face-to-face time during this encounter.   Tula Nakayama, MD

## 2020-08-06 NOTE — Assessment & Plan Note (Signed)
CT scan of 02/2020 at [facility], multiple nodules , repeat in 1 year

## 2020-08-06 NOTE — Assessment & Plan Note (Signed)
Controlled, no change in medication, daily paxil

## 2020-08-06 NOTE — Assessment & Plan Note (Signed)
DASH diet and commitment to daily physical activity for a minimum of 30 minutes discussed and encouraged, as a part of hypertension management. The importance of attaining a healthy weight is also discussed.  BP/Weight 08/05/2020 03/31/2020 05/14/2019 11/12/2018 04/04/2018 02/06/9037 12/04/3830  Systolic BP 919 166 060 045 997 741 423  Diastolic BP 82 82 70 80 74 82 75  Wt. (Lbs) 163 164 165 168 165.04 165 165  BMI 23.39 23.53 23.68 24.11 23.68 23.68 24.37

## 2020-08-17 ENCOUNTER — Ambulatory Visit: Payer: BLUE CROSS/BLUE SHIELD | Admitting: Family Medicine

## 2020-08-20 ENCOUNTER — Ambulatory Visit (HOSPITAL_COMMUNITY)
Admission: RE | Admit: 2020-08-20 | Discharge: 2020-08-20 | Disposition: A | Discharge status: Home / Self Care | Payer: Medicare Other | Source: Ambulatory Visit | Attending: Family Medicine | Admitting: Family Medicine

## 2020-08-20 ENCOUNTER — Other Ambulatory Visit: Payer: Self-pay

## 2020-08-20 DIAGNOSIS — Z87891 Personal history of nicotine dependence: Secondary | ICD-10-CM | POA: Insufficient documentation

## 2020-11-04 ENCOUNTER — Other Ambulatory Visit: Payer: Self-pay | Admitting: Family Medicine

## 2020-11-04 DIAGNOSIS — M5441 Lumbago with sciatica, right side: Secondary | ICD-10-CM

## 2021-01-03 ENCOUNTER — Other Ambulatory Visit: Payer: Self-pay | Admitting: Family Medicine

## 2021-01-03 DIAGNOSIS — M5441 Lumbago with sciatica, right side: Secondary | ICD-10-CM

## 2021-02-04 ENCOUNTER — Other Ambulatory Visit: Payer: Self-pay | Admitting: Family Medicine

## 2021-03-23 ENCOUNTER — Other Ambulatory Visit: Payer: Self-pay

## 2021-03-23 ENCOUNTER — Encounter: Payer: Self-pay | Admitting: Family Medicine

## 2021-03-23 ENCOUNTER — Ambulatory Visit (INDEPENDENT_AMBULATORY_CARE_PROVIDER_SITE_OTHER): Payer: Medicare Other | Admitting: Family Medicine

## 2021-03-23 VITALS — BP 126/79 | HR 64 | Temp 98.1°F | Resp 20 | Ht 69.0 in | Wt 169.0 lb

## 2021-03-23 DIAGNOSIS — I10 Essential (primary) hypertension: Secondary | ICD-10-CM

## 2021-03-23 DIAGNOSIS — Z122 Encounter for screening for malignant neoplasm of respiratory organs: Secondary | ICD-10-CM

## 2021-03-23 DIAGNOSIS — Z0000 Encounter for general adult medical examination without abnormal findings: Secondary | ICD-10-CM

## 2021-03-23 DIAGNOSIS — E785 Hyperlipidemia, unspecified: Secondary | ICD-10-CM

## 2021-03-23 DIAGNOSIS — Z23 Encounter for immunization: Secondary | ICD-10-CM

## 2021-03-23 DIAGNOSIS — E559 Vitamin D deficiency, unspecified: Secondary | ICD-10-CM

## 2021-03-23 DIAGNOSIS — Z125 Encounter for screening for malignant neoplasm of prostate: Secondary | ICD-10-CM

## 2021-03-23 MED ORDER — PANTOPRAZOLE SODIUM 20 MG PO TBEC: 20.0000 mg | DELAYED_RELEASE_TABLET | Freq: Every day | ORAL | 1 refills | Status: DC

## 2021-03-23 NOTE — Patient Instructions (Addendum)
F/u in 6 months, call if you need me sooner  Labs today, CBC, lipid, cm and eGFR, PSA, TSH and vit D   Pneumonia 23 today Vision screen today with glasses on   Please get your 2 shingles vaccines in the next 6 months  It is important that you exercise regularly at least 30 minutes 5 times a week. If you develop chest pain, have severe difficulty breathing, or feel very tired, stop exercising immediately and seek medical attention    Thanks for choosing [facility], we consider it a privelige to serve you.

## 2021-03-23 NOTE — Progress Notes (Signed)
   Eugene Acevedo     MRN: [id number]      DOB: 1956/09/05   HPI: Patient is in for annual physical exam. No other health concerns are expressed or addressed at the visit. Recent labs, if available are reviewed. Immunization is reviewed , and  updated if needed.    PE; BP 126/79 (BP Location: Right Arm, Patient Position: Sitting, Cuff Size: Large)   Pulse 64   Temp 98.1 F (36.7 C)   Resp 20   Ht 5\' 9"  (1.753 m)   Wt 169 lb (76.7 kg)   SpO2 98%   BMI 24.96 kg/m   Pleasant male, alert and oriented x 3, in no cardio-pulmonary distress. Afebrile. HEENT No facial trauma or asymetry. Sinuses non tender. EOMI External ears normal,  Neck: supple, no adenopathy,JVD or thyromegaly.No bruits.  Chest: Clear to ascultation bilaterally.No crackles or wheezes. Non tender to palpation  Cardiovascular system; Heart sounds normal,  S1 and  S2 ,no S3.  No murmur, or thrill. Apical beat not displaced Peripheral pulses normal.  Abdomen: Soft, non tender, no organomegaly or masses. No bruits. Bowel sounds normal. No guarding, tenderness or rebound.    Musculoskeletal exam: Full ROM of spine, hips , shoulders and knees. No deformity ,swelling or crepitus noted. No muscle wasting or atrophy.   Neurologic: Cranial nerves 2 to 12 intact. Power, tone ,sensation and reflexes normal throughout. No disturbance in gait. No tremor.  Skin: Intact, no ulceration, erythema , scaling or rash noted. Pigmentation normal throughout  Psych;Not depressed or anxious  Annual physical exam Annual exam as documented. Counseling done  re healthy lifestyle involving commitment to 150 minutes exercise per week, heart healthy diet, and attaining healthy weight.The importance of adequate sleep also discussed. Regular seat belt use and home safety, is also discussed. Changes in health habits are decided on by the patient with goals and time frames  set for achieving them. Immunization and cancer  screening needs are specifically addressed at this visit.

## 2021-03-23 NOTE — Assessment & Plan Note (Signed)

## 2021-03-24 ENCOUNTER — Telehealth: Payer: Self-pay

## 2021-03-24 LAB — CBC
Hematocrit: 45.9 % (ref 37.5–51.0)
Hemoglobin: 16 g/dL (ref 13.0–17.7)
MCH: 32.3 pg (ref 26.6–33.0)
MCHC: 34.9 g/dL (ref 31.5–35.7)
MCV: 93 fL (ref 79–97)
Platelets: 239 10*3/uL (ref 150–450)
RBC: 4.96 x10E6/uL (ref 4.14–5.80)
RDW: 12.2 % (ref 11.6–15.4)
WBC: 7.4 10*3/uL (ref 3.4–10.8)

## 2021-03-24 LAB — CMP14+EGFR
ALT: 17 IU/L (ref 0–44)
AST: 17 IU/L (ref 0–40)
Albumin/Globulin Ratio: 1.8 (ref 1.2–2.2)
Albumin: 4.7 g/dL (ref 3.8–4.8)
Alkaline Phosphatase: 105 IU/L (ref 44–121)
BUN/Creatinine Ratio: 10 (ref 10–24)
BUN: 9 mg/dL (ref 8–27)
Bilirubin Total: 0.5 mg/dL (ref 0.0–1.2)
CO2: 21 mmol/L (ref 20–29)
Calcium: 9.2 mg/dL (ref 8.6–10.2)
Chloride: 103 mmol/L (ref 96–106)
Creatinine, Ser: 0.86 mg/dL (ref 0.76–1.27)
Globulin, Total: 2.6 g/dL (ref 1.5–4.5)
Glucose: 102 mg/dL — ABNORMAL HIGH (ref 65–99)
Potassium: 4.7 mmol/L (ref 3.5–5.2)
Sodium: 139 mmol/L (ref 134–144)
Total Protein: 7.3 g/dL (ref 6.0–8.5)
eGFR: 96 mL/min/{1.73_m2} (ref >59)

## 2021-03-24 LAB — LIPID PANEL
Chol/HDL Ratio: 3.2 ratio (ref 0.0–5.0)
Cholesterol, Total: 210 mg/dL — ABNORMAL HIGH (ref 100–199)
HDL: 66 mg/dL (ref >39)
LDL Chol Calc (NIH): 132 mg/dL — ABNORMAL HIGH (ref 0–99)
Triglycerides: 68 mg/dL (ref 0–149)
VLDL Cholesterol Cal: 12 mg/dL (ref 5–40)

## 2021-03-24 LAB — PSA: Prostate Specific Ag, Serum: 0.7 ng/mL (ref 0.0–4.0)

## 2021-03-24 LAB — TSH: TSH: 1.01 u[IU]/mL (ref 0.450–4.500)

## 2021-03-24 LAB — VITAMIN D 25 HYDROXY (VIT D DEFICIENCY, FRACTURES): Vit D, 25-Hydroxy: 34 ng/mL (ref 30.0–100.0)

## 2021-03-24 NOTE — Telephone Encounter (Signed)
Called pt back and went again to voicemail. Will try again

## 2021-03-24 NOTE — Telephone Encounter (Signed)
Pt is returning your call

## 2021-03-29 ENCOUNTER — Other Ambulatory Visit: Payer: Self-pay | Admitting: Family Medicine

## 2021-03-30 ENCOUNTER — Other Ambulatory Visit: Payer: Self-pay | Admitting: Family Medicine

## 2021-04-03 ENCOUNTER — Other Ambulatory Visit: Payer: Self-pay | Admitting: Family Medicine

## 2021-04-03 DIAGNOSIS — M5441 Lumbago with sciatica, right side: Secondary | ICD-10-CM

## 2021-04-08 IMAGING — US US ABDOMINAL AORTA SCREENING AAA
2 series · 14 of 18 positions shown · non-contrast
Comparison: None.

CLINICAL DATA: Male between 65-75 years of age with a smoking
history.

EXAM:
US ABDOMINAL AORTA MEDICARE SCREENING
TECHNIQUE: Ultrasound examination of the abdominal aorta was performed as a
screening evaluation for abdominal aortic aneurysm.

[Series 1: us aorta medicare screening · 12 of 16 slices shown]
[im 1/16]
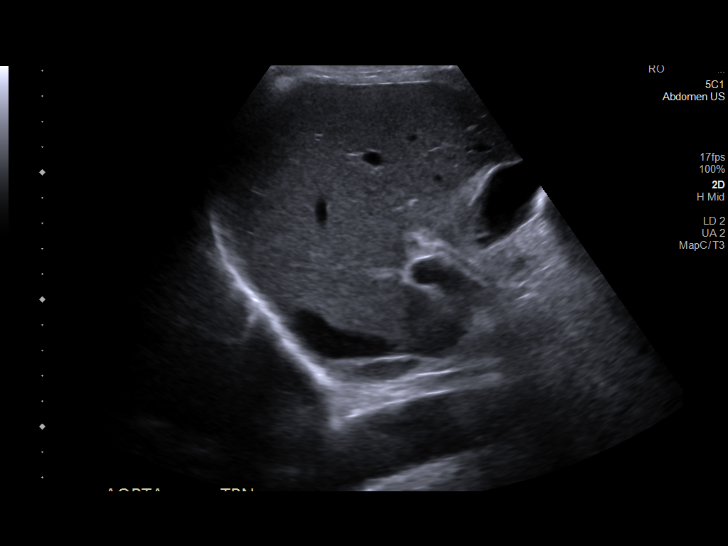
[im 2/16]
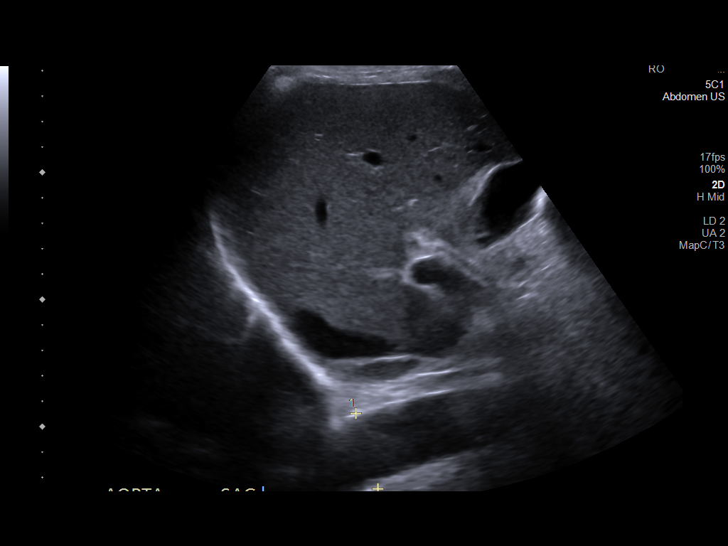
[im 4/16]
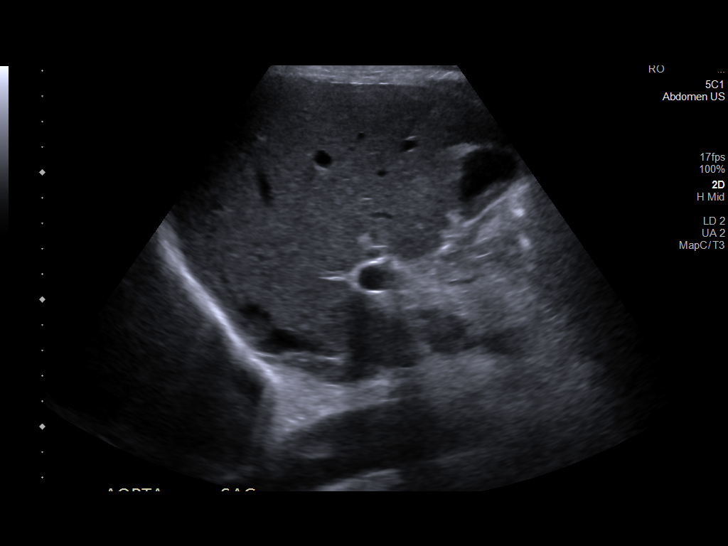
[im 5/16]
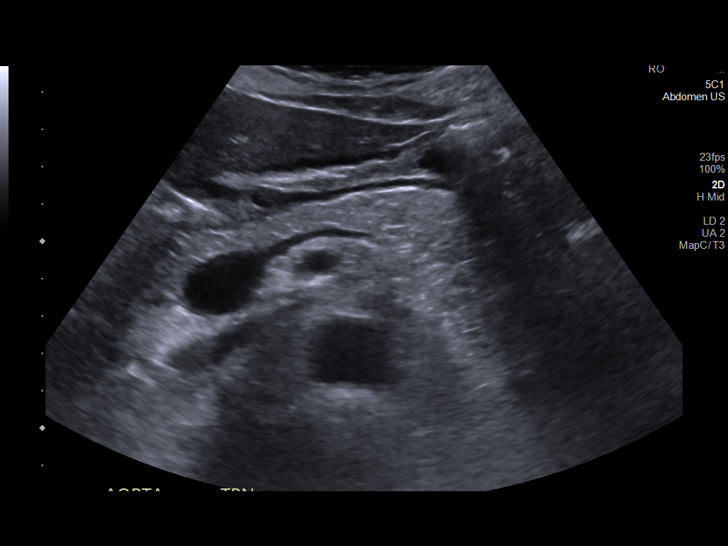
[im 6/16]
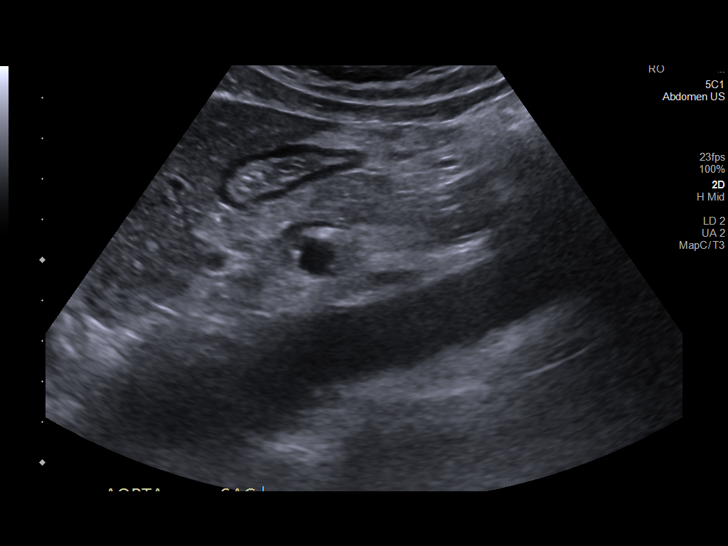
[im 8/16]
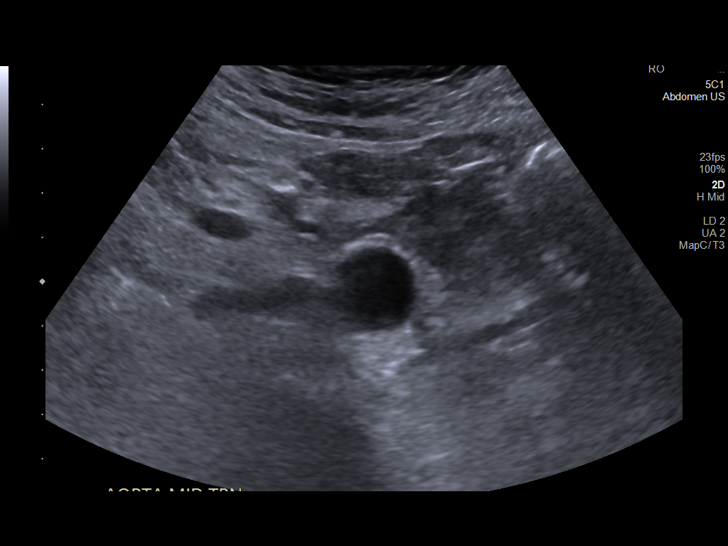
[im 9/16]
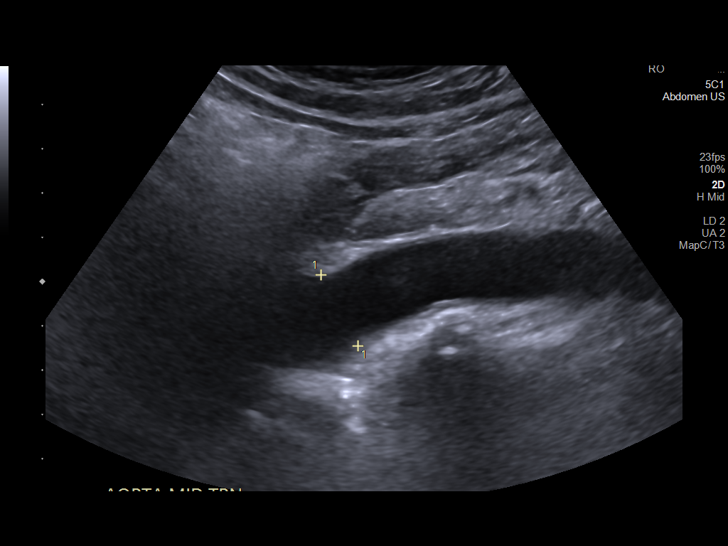
[im 10/16]
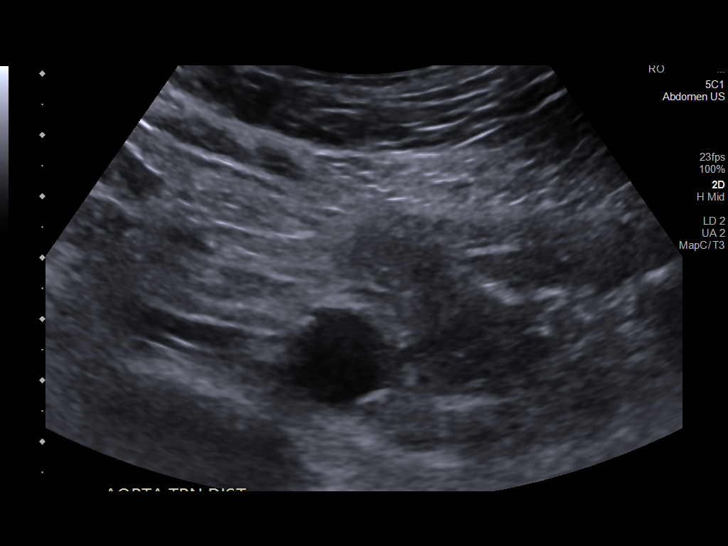
[im 11/16]
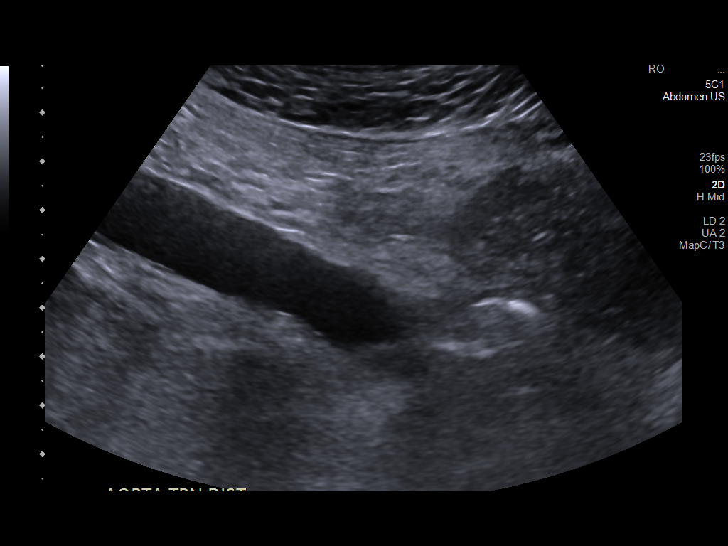
[im 13/16]
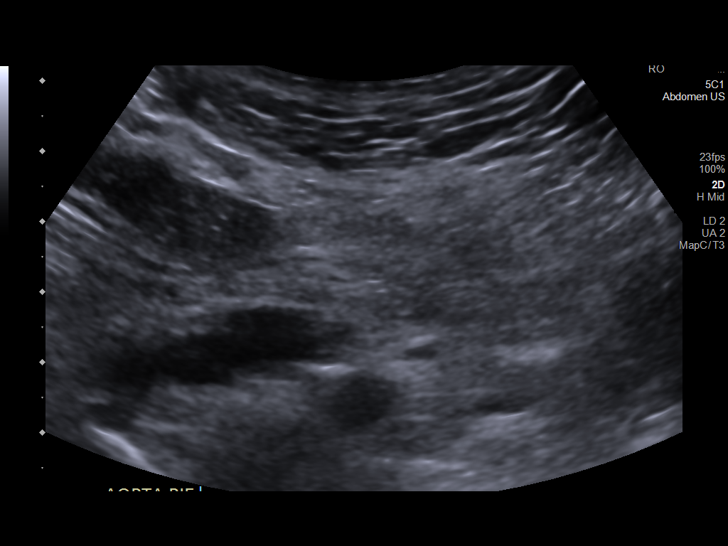
[im 14/16]
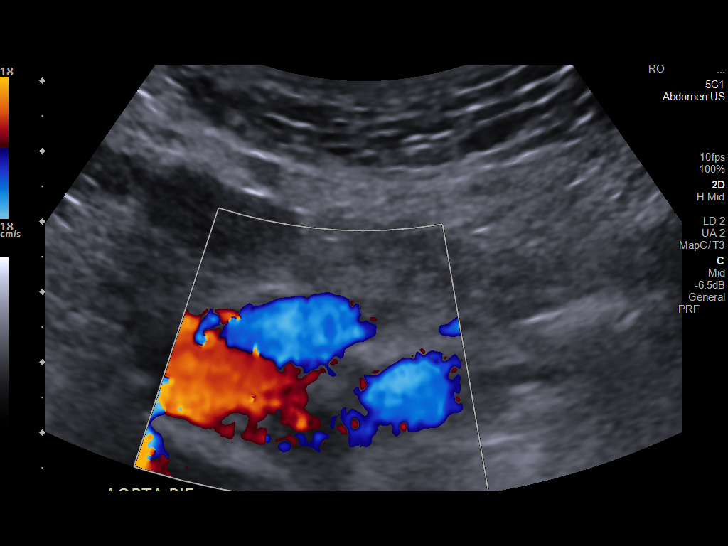
[im 15/16]
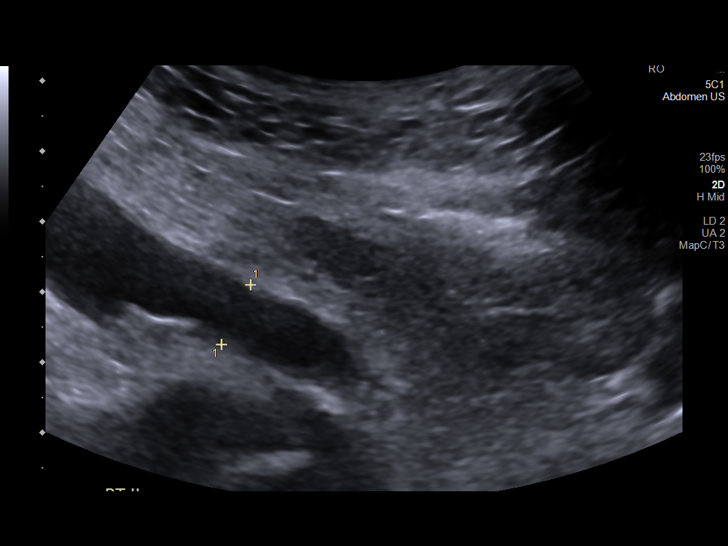

[Series 1001: abdomen us · 2 of 2 slices shown]
[im 1/2]
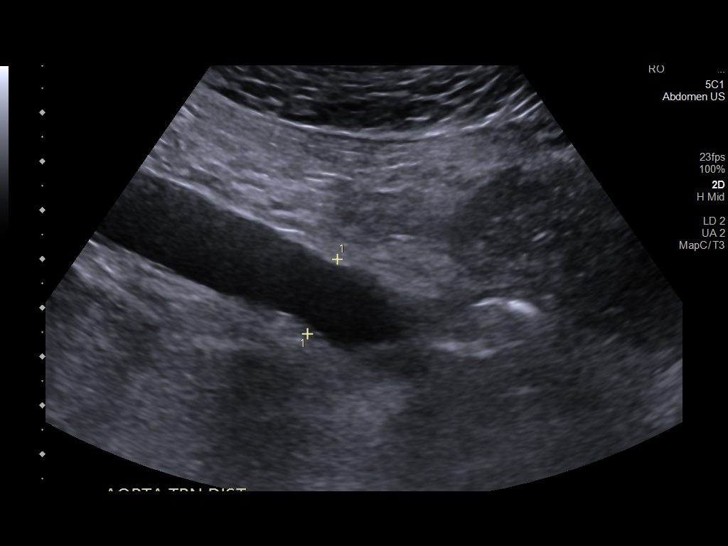
[im 2/2]
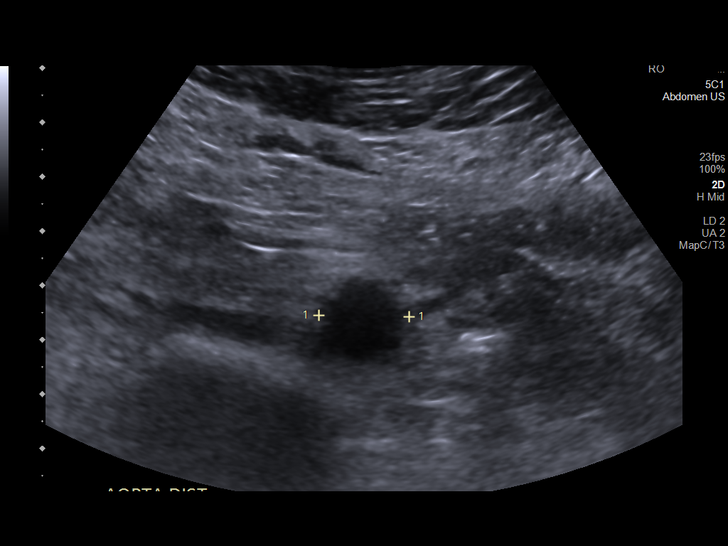

[14 of 18 positions shown; findings below may reference images not displayed]

FINDINGS: Abdominal aortic measurements as follows:

Proximal:  3.1 x 2.4 cm

Mid:  1.8 x 2.1 cm

Distal:  1.7 x 1.7 cm
IMPRESSION: Borderline aneurysmal dilation of the proximal abdominal aorta
measuring up to 3.1 cm. Recommend follow-up every 3 years. This
recommendation follows ACR consensus guidelines: White Paper of the
ACR Incidental Findings Committee II on Vascular Findings. [HOSPITAL] 9724; [DATE].

## 2021-04-12 ENCOUNTER — Encounter (HOSPITAL_COMMUNITY): Payer: Self-pay

## 2021-04-12 ENCOUNTER — Other Ambulatory Visit (HOSPITAL_COMMUNITY): Payer: Self-pay

## 2021-04-12 DIAGNOSIS — Z122 Encounter for screening for malignant neoplasm of respiratory organs: Secondary | ICD-10-CM

## 2021-04-12 DIAGNOSIS — Z87891 Personal history of nicotine dependence: Secondary | ICD-10-CM

## 2021-04-12 NOTE — Progress Notes (Signed)
Patient returned my call to schedule LDCT. Patient a former smoker, having quit 4 years ago with a 41 pack year history. Patient has no symptoms including unexplained cough, unexplained weight loss or hemoptysis. Patient scheduled for LDCT on 05/06/2021 at 0800. Patient aware.

## 2021-04-12 NOTE — Progress Notes (Signed)
I have attempted to call the patient regarding LDCT, unable to reach patient at this time. Detailed VM left asking that the patient return my call.

## 2021-05-06 ENCOUNTER — Ambulatory Visit (HOSPITAL_COMMUNITY): Admission: RE | Admit: 2021-05-06 | Payer: Medicare Other | Source: Ambulatory Visit

## 2021-05-09 ENCOUNTER — Other Ambulatory Visit: Payer: Self-pay | Admitting: Family Medicine

## 2021-05-15 ENCOUNTER — Other Ambulatory Visit: Payer: Self-pay | Admitting: Family Medicine

## 2021-06-10 ENCOUNTER — Ambulatory Visit (HOSPITAL_COMMUNITY)
Admission: RE | Admit: 2021-06-10 | Discharge: 2021-06-10 | Disposition: A | Discharge status: Home / Self Care | Payer: Medicare Other | Source: Ambulatory Visit | Attending: Physician Assistant | Admitting: Physician Assistant

## 2021-06-10 ENCOUNTER — Other Ambulatory Visit: Payer: Self-pay

## 2021-06-10 DIAGNOSIS — Z122 Encounter for screening for malignant neoplasm of respiratory organs: Secondary | ICD-10-CM | POA: Diagnosis not present

## 2021-06-10 DIAGNOSIS — Z87891 Personal history of nicotine dependence: Secondary | ICD-10-CM | POA: Insufficient documentation

## 2021-06-21 ENCOUNTER — Ambulatory Visit (INDEPENDENT_AMBULATORY_CARE_PROVIDER_SITE_OTHER): Payer: Medicare Other | Admitting: Nurse Practitioner

## 2021-06-21 ENCOUNTER — Encounter: Payer: Self-pay | Admitting: Nurse Practitioner

## 2021-06-21 ENCOUNTER — Other Ambulatory Visit: Payer: Self-pay

## 2021-06-21 VITALS — BP 136/85 | HR 63 | Ht 70.0 in | Wt 175.0 lb

## 2021-06-21 DIAGNOSIS — Z0000 Encounter for general adult medical examination without abnormal findings: Secondary | ICD-10-CM | POA: Diagnosis not present

## 2021-06-21 NOTE — Patient Instructions (Signed)
Eugene Acevedo , Thank you for taking time to come for your Medicare Wellness Visit. I appreciate your ongoing commitment to your health goals. Please review the following plan we discussed and let me know if I can assist you in the future.   Screening recommendations/referrals: Colonoscopy: 03/02/22 Recommended yearly ophthalmology/optometry visit for glaucoma screening and checkup Recommended yearly dental visit for hygiene and checkup  Vaccinations: Influenza vaccine: 05/03/21 Pneumococcal vaccine: Declined Tdap vaccine: 04/26/27 Shingles vaccine: Declined    Advanced directives: No  Conditions/risks identified: None  Next appointment: AWV in 1 year.   Preventive Care 67 Years and Older, Male Preventive care refers to lifestyle choices and visits with your health care provider that can promote health and wellness. What does preventive care include? A yearly physical exam. This is also called an annual well check. Dental exams once or twice a year. Routine eye exams. Ask your health care provider how often you should have your eyes checked. Personal lifestyle choices, including: Daily care of your teeth and gums. Regular physical activity. Eating a healthy diet. Avoiding tobacco and drug use. Limiting alcohol use. Practicing safe sex. Taking low doses of aspirin every day. Taking vitamin and mineral supplements as recommended by your health care provider. What happens during an annual well check? The services and screenings done by your health care provider during your annual well check will depend on your age, overall health, lifestyle risk factors, and family history of disease. Counseling  Your health care provider may ask you questions about your: Alcohol use. Tobacco use. Drug use. Emotional well-being. Home and relationship well-being. Sexual activity. Eating habits. History of falls. Memory and ability to understand (cognition). Work and work Statistician. Screening   You may have the following tests or measurements: Height, weight, and BMI. Blood pressure. Lipid and cholesterol levels. These may be checked every 5 years, or more frequently if you are over 59 years old. Skin check. Lung cancer screening. You may have this screening every year starting at age 62 if you have a 30-pack-year history of smoking and currently smoke or have quit within the past 15 years. Fecal occult blood test (FOBT) of the stool. You may have this test every year starting at age 46. Flexible sigmoidoscopy or colonoscopy. You may have a sigmoidoscopy every 5 years or a colonoscopy every 10 years starting at age 79. Prostate cancer screening. Recommendations will vary depending on your family history and other risks. Hepatitis C blood test. Hepatitis B blood test. Sexually transmitted disease (STD) testing. Diabetes screening. This is done by checking your blood sugar (glucose) after you have not eaten for a while (fasting). You may have this done every 1-3 years. Abdominal aortic aneurysm (AAA) screening. You may need this if you are a current or former smoker. Osteoporosis. You may be screened starting at age 26 if you are at high risk. Talk with your health care provider about your test results, treatment options, and if necessary, the need for more tests. Vaccines  Your health care provider may recommend certain vaccines, such as: Influenza vaccine. This is recommended every year. Tetanus, diphtheria, and acellular pertussis (Tdap, Td) vaccine. You may need a Td booster every 10 years. Zoster vaccine. You may need this after age 6. Pneumococcal 13-valent conjugate (PCV13) vaccine. One dose is recommended after age 20. Pneumococcal polysaccharide (PPSV23) vaccine. One dose is recommended after age 75. Talk to your health care provider about which screenings and vaccines you need and how often you need them. This  information is not intended to replace advice given to you by  your health care provider. Make sure you discuss any questions you have with your health care provider. Document Released: 10/16/2015 Document Revised: 06/08/2016 Document Reviewed: 07/21/2015 Elsevier Interactive Patient Education  2017 [place] Prevention in the Home Falls can cause injuries. They can happen to people of all ages. There are many things you can do to make your home safe and to help prevent falls. What can I do on the outside of my home? Regularly fix the edges of walkways and driveways and fix any cracks. Remove anything that might make you trip as you walk through a door, such as a raised step or threshold. Trim any bushes or trees on the path to your home. Use bright outdoor lighting. Clear any walking paths of anything that might make someone trip, such as rocks or tools. Regularly check to see if handrails are loose or broken. Make sure that both sides of any steps have handrails. Any raised decks and porches should have guardrails on the edges. Have any leaves, snow, or ice cleared regularly. Use sand or salt on walking paths during winter. Clean up any spills in your garage right away. This includes oil or grease spills. What can I do in the bathroom? Use night lights. Install grab bars by the toilet and in the tub and shower. Do not use towel bars as grab bars. Use non-skid mats or decals in the tub or shower. If you need to sit down in the shower, use a plastic, non-slip stool. Keep the floor dry. Clean up any water that spills on the floor as soon as it happens. Remove soap buildup in the tub or shower regularly. Attach bath mats securely with double-sided non-slip rug tape. Do not have throw rugs and other things on the floor that can make you trip. What can I do in the bedroom? Use night lights. Make sure that you have a light by your bed that is easy to reach. Do not use any sheets or blankets that are too big for your bed. They should not hang  down onto the floor. Have a firm chair that has side arms. You can use this for support while you get dressed. Do not have throw rugs and other things on the floor that can make you trip. What can I do in the kitchen? Clean up any spills right away. Avoid walking on wet floors. Keep items that you use a lot in easy-to-reach places. If you need to reach something above you, use a strong step stool that has a grab bar. Keep electrical cords out of the way. Do not use floor polish or wax that makes floors slippery. If you must use wax, use non-skid floor wax. Do not have throw rugs and other things on the floor that can make you trip. What can I do with my stairs? Do not leave any items on the stairs. Make sure that there are handrails on both sides of the stairs and use them. Fix handrails that are broken or loose. Make sure that handrails are as long as the stairways. Check any carpeting to make sure that it is firmly attached to the stairs. Fix any carpet that is loose or worn. Avoid having throw rugs at the top or bottom of the stairs. If you do have throw rugs, attach them to the floor with carpet tape. Make sure that you have a light switch at the top  of the stairs and the bottom of the stairs. If you do not have them, ask someone to add them for you. What else can I do to help prevent falls? Wear shoes that: Do not have high heels. Have rubber bottoms. Are comfortable and fit you well. Are closed at the toe. Do not wear sandals. If you use a stepladder: Make sure that it is fully opened. Do not climb a closed stepladder. Make sure that both sides of the stepladder are locked into place. Ask someone to hold it for you, if possible. Clearly mark and make sure that you can see: Any grab bars or handrails. First and last steps. Where the edge of each step is. Use tools that help you move around (mobility aids) if they are needed. These  include: Canes. Walkers. Scooters. Crutches. Turn on the lights when you go into a dark area. Replace any light bulbs as soon as they burn out. Set up your furniture so you have a clear path. Avoid moving your furniture around. If any of your floors are uneven, fix them. If there are any pets around you, be aware of where they are. Review your medicines with your doctor. Some medicines can make you feel dizzy. This can increase your chance of falling. Ask your doctor what other things that you can do to help prevent falls. This information is not intended to replace advice given to you by your health care provider. Make sure you discuss any questions you have with your health care provider. Document Released: 07/16/2009 Document Revised: 02/25/2016 Document Reviewed: 10/24/2014 Elsevier Interactive Patient Education  2017 [employer].

## 2021-06-21 NOTE — Progress Notes (Signed)
Acute Office Visit  Subjective:    Patient ID: Eugene Acevedo, male    DOB: Apr 30, 1956, 65 y.o.   MRN: [id number]  Chief Complaint  Patient presents with   Annual Exam    Welcome to Medicare    HPI Patient is in today for AWV.   Past Medical History:  Diagnosis Date   Anxiety    Aortic atherosclerosis ([place])    CT 11/02/16   Arthritis    wrists   Complication of anesthesia    Difficult to awake   Diverticulitis of sigmoid colon    Erectile dysfunction    GAD (generalized anxiety disorder)    GERD (gastroesophageal reflux disease)    Hyperlipidemia    no meds now   Hypertension    Insomnia    Internal hemorrhoid    Nicotine addiction    Pulmonary nodules    small bilateral lungs CT 11/02/16   Ulcer     Past Surgical History:  Procedure Laterality Date   COLONOSCOPY  03/02/2017   INGUINAL HERNIA REPAIR Left 10/04/2017   Procedure: LEFT HERNIA REPAIR INGUINAL WITH MESH;  Surgeon: Ileana Roup, MD;  Location: [facility];  Service: General;  Laterality: Left;   INSERTION OF MESH Left 10/04/2017   Procedure: INSERTION OF MESH;  Surgeon: Ileana Roup, MD;  Location: [facility];  Service: General;  Laterality: Left;   POLYPECTOMY     UPPER GASTROINTESTINAL ENDOSCOPY      Family History  Problem Relation Age of Onset   Hypertension Mother    Hypertension Father        DJD    Colon cancer Neg Hx    Esophageal cancer Neg Hx    Rectal cancer Neg Hx    Stomach cancer Neg Hx    Colon polyps Neg Hx     Social History   Socioeconomic History   Marital status: Married    Spouse name: Not on file   Number of children: Not on file   Years of education: Not on file   Highest education level: Not on file  Occupational History   Occupation: Dealer   Tobacco Use   Smoking status: Former    Packs/day: 0.50    Years: 40.00    Pack years: 20.00    Types: Cigarettes    Quit date: 11/07/2012    Years since quitting: 8.6   Smokeless tobacco: Never  Vaping Use    Vaping Use: Never used  Substance and Sexual Activity   Alcohol use: Yes    Comment: 3 beers a day    Drug use: No   Sexual activity: Never  Other Topics Concern   Not on file  Social History Narrative   Not on file   Social Determinants of Health   Financial Resource Strain: Low Risk    Difficulty of Paying Living Expenses: Not hard at all  Food Insecurity: No Food Insecurity   Worried About Charity fundraiser in the Last Year: Never true   Arboriculturist in the Last Year: Never true  Transportation Needs: No Transportation Needs   Lack of Transportation (Medical): No   Lack of Transportation (Non-Medical): No  Physical Activity: Insufficiently Active   Days of Exercise per Week: 2 days   Minutes of Exercise per Session: 20 min  Stress: No Stress Concern Present   Feeling of Stress : Only a little  Social Connections: Socially Isolated   Frequency of Communication with Friends and  Family: More than three times a week   Frequency of Social Gatherings with Friends and Family: Twice a week   Attends Religious Services: Never   Marine scientist or Organizations: No   Attends Archivist Meetings: Never   Marital Status: Widowed  Human resources officer Violence: Not At Risk   Fear of Current or Ex-Partner: No   Emotionally Abused: No   Physically Abused: No   Sexually Abused: No    Outpatient Medications Prior to Visit  Medication Sig Dispense Refill   atorvastatin (LIPITOR) 20 MG tablet Take 1 tablet (20 mg total) by mouth daily. 90 tablet 3   cholecalciferol (VITAMIN D) 1000 units tablet Take 1,000 Units by mouth daily.      Cyanocobalamin (VITAMIN B 12 PO) Take 1 tablet by mouth daily.      gabapentin (NEURONTIN) 100 MG capsule TAKE 1 CAPSULE(100 MG) BY MOUTH AT BEDTIME 30 capsule 1   lisinopril (ZESTRIL) 20 MG tablet Take 1 tablet (20 mg total) by mouth daily. 90 tablet 3   pantoprazole (PROTONIX) 20 MG tablet Take 1 tablet (20 mg total) by mouth daily. 90  tablet 1   PARoxetine (PAXIL) 10 MG tablet TAKE 1 TABLET(10 MG) BY MOUTH DAILY 30 tablet 5   No facility-administered medications prior to visit.    No Known Allergies  Review of Systems     Objective:    Physical Exam  BP 136/85 (BP Location: Left Arm, Patient Position: Sitting, Cuff Size: Large)   Pulse 63   Ht _0  (1.778 m)   Wt 175 lb (79.4 kg)   SpO2 96%   BMI 25.11 kg/m  Wt Readings from Last 3 Encounters:  06/21/21 175 lb (79.4 kg)  03/23/21 169 lb (76.7 kg)  08/05/20 163 lb (73.9 kg)    There are no preventive care reminders to display for this patient.  There are no preventive care reminders to display for this patient.   Lab Results  Component Value Date   TSH 1.010 03/23/2021   Lab Results  Component Value Date   WBC 7.4 03/23/2021   HGB 16.0 03/23/2021   HCT 45.9 03/23/2021   MCV 93 03/23/2021   PLT 239 03/23/2021   Lab Results  Component Value Date   NA 139 03/23/2021   K 4.7 03/23/2021   CO2 21 03/23/2021   GLUCOSE 102 (H) 03/23/2021   BUN 9 03/23/2021   CREATININE 0.86 03/23/2021   BILITOT 0.5 03/23/2021   ALKPHOS 105 03/23/2021   AST 17 03/23/2021   ALT 17 03/23/2021   PROT 7.3 03/23/2021   ALBUMIN 4.7 03/23/2021   CALCIUM 9.2 03/23/2021   ANIONGAP 7 09/29/2017   EGFR 96 03/23/2021   Lab Results  Component Value Date   CHOL 210 (H) 03/23/2021   Lab Results  Component Value Date   HDL 66 03/23/2021   Lab Results  Component Value Date   LDLCALC 132 (H) 03/23/2021   Lab Results  Component Value Date   TRIG 68 03/23/2021   Lab Results  Component Value Date   CHOLHDL 3.2 03/23/2021   Lab Results  Component Value Date   HGBA1C 5.3 10/12/2015       Assessment & Plan:   Problem List Items Addressed This Visit       Other   Annual physical exam    -EKG performed as part of AWV      Other Visit Diagnoses     Encounter for Medicare annual  wellness exam    -  Primary        No orders of the defined  types were placed in this encounter.    Noreene Larsson, NP

## 2021-06-21 NOTE — Assessment & Plan Note (Signed)
-  EKG performed as part of AWV

## 2021-06-21 NOTE — Progress Notes (Signed)
Subjective:   Eugene Acevedo is a 65 y.o. male who presents for Medicare Annual/Subsequent preventive examination.  Review of Systems       Objective:    There were no vitals filed for this visit. There is no height or weight on file to calculate BMI.  Advanced Directives 09/29/2017 07/14/2017 02/16/2017  Does Patient Have a Medical Advance Directive? No No No  Would patient like information on creating a medical advance directive? No - Patient declined - -    Current Medications (verified) Outpatient Encounter Medications as of 06/21/2021  Medication Sig   atorvastatin (LIPITOR) 20 MG tablet Take 1 tablet (20 mg total) by mouth daily.   cholecalciferol (VITAMIN D) 1000 units tablet Take 1,000 Units by mouth daily.    Cyanocobalamin (VITAMIN B 12 PO) Take 1 tablet by mouth daily.    gabapentin (NEURONTIN) 100 MG capsule TAKE 1 CAPSULE(100 MG) BY MOUTH AT BEDTIME   lisinopril (ZESTRIL) 20 MG tablet Take 1 tablet (20 mg total) by mouth daily.   pantoprazole (PROTONIX) 20 MG tablet Take 1 tablet (20 mg total) by mouth daily.   PARoxetine (PAXIL) 10 MG tablet TAKE 1 TABLET(10 MG) BY MOUTH DAILY   No facility-administered encounter medications on file as of 06/21/2021.    Allergies (verified) Patient has no known allergies.   History: Past Medical History:  Diagnosis Date   Anxiety    Aortic atherosclerosis ([place])    CT 11/02/16   Arthritis    wrists   Complication of anesthesia    Difficult to awake   Diverticulitis of sigmoid colon    Erectile dysfunction    GAD (generalized anxiety disorder)    GERD (gastroesophageal reflux disease)    Hyperlipidemia    no meds now   Hypertension    Insomnia    Internal hemorrhoid    Nicotine addiction    Pulmonary nodules    small bilateral lungs CT 11/02/16   Ulcer    Past Surgical History:  Procedure Laterality Date   COLONOSCOPY  03/02/2017   INGUINAL HERNIA REPAIR Left 10/04/2017   Procedure: LEFT HERNIA REPAIR INGUINAL  WITH MESH;  Surgeon: Ileana Roup, MD;  Location: [facility];  Service: General;  Laterality: Left;   INSERTION OF MESH Left 10/04/2017   Procedure: INSERTION OF MESH;  Surgeon: Ileana Roup, MD;  Location: [facility];  Service: General;  Laterality: Left;   POLYPECTOMY     UPPER GASTROINTESTINAL ENDOSCOPY     Family History  Problem Relation Age of Onset   Hypertension Mother    Hypertension Father        DJD    Colon cancer Neg Hx    Esophageal cancer Neg Hx    Rectal cancer Neg Hx    Stomach cancer Neg Hx    Colon polyps Neg Hx    Social History   Socioeconomic History   Marital status: Married    Spouse name: Not on file   Number of children: Not on file   Years of education: Not on file   Highest education level: Not on file  Occupational History   Occupation: Dealer   Tobacco Use   Smoking status: Former    Packs/day: 0.50    Years: 40.00    Pack years: 20.00    Types: Cigarettes    Quit date: 11/07/2012    Years since quitting: 8.6   Smokeless tobacco: Never  Vaping Use   Vaping Use: Never used  Substance  and Sexual Activity   Alcohol use: Yes    Comment: 3 beers a day    Drug use: No   Sexual activity: Never  Other Topics Concern   Not on file  Social History Narrative   Not on file   Social Determinants of Health   Financial Resource Strain: Not on file  Food Insecurity: Not on file  Transportation Needs: Not on file  Physical Activity: Not on file  Stress: Not on file  Social Connections: Not on file    Tobacco Counseling Counseling given: Not Answered   Clinical Intake:                 Diabetic? no         Activities of Daily Living No flowsheet data found.  Patient Care Team: Fayrene Helper, MD as PCP - General  Indicate any recent Medical Services you may have received from other than [facility] providers in the past year (date may be approximate).     Assessment:   This is a routine wellness examination for  Ido.  Hearing/Vision screen No results found.  Dietary issues and exercise activities discussed:     Goals Addressed   None   Depression Screen PHQ 2/9 Scores 03/23/2021 08/05/2020 03/31/2020 05/14/2019 11/12/2018 11/12/2018 04/04/2018  PHQ - 2 Score 0 0 0 0 '1 1 4  '$ PHQ- 9 Score - - - - 4 - 5    Fall Risk Fall Risk  03/23/2021 08/05/2020 03/31/2020 05/14/2019 11/12/2018  Falls in the past year? 0 0 1 0 0  Number falls in past yr: 0 - 0 0 -  Injury with Fall? 0 - 0 0 0  Risk for fall due to : No Fall Risks - - - -  Follow up Falls evaluation completed - - - -    FALL RISK PREVENTION PERTAINING TO THE HOME:  Any stairs in or around the home? No  If so, are there any without handrails?  N/a Home free of loose throw rugs in walkways, pet beds, electrical cords, etc? Yes  Adequate lighting in your home to reduce risk of falls? Yes   ASSISTIVE DEVICES UTILIZED TO PREVENT FALLS:  Life alert? No  Use of a cane, walker or w/c? No  Grab bars in the bathroom? No  Shower chair or bench in shower? No  Elevated toilet seat or a handicapped toilet? No   TIMED UP AND GO:  Was the test performed? No .  Length of time to ambulate: n/a    Cognitive Function:        Immunizations Immunization History  Administered Date(s) Administered   Influenza Split 06/20/2014   Influenza Whole 07/12/2007   Influenza,inj,Quad PF,6+ Mos 09/23/2015, 09/07/2016   Pneumococcal Conjugate-13 09/03/2014   Pneumococcal Polysaccharide-23 03/23/2021   Td 11/09/2006   Tdap 04/25/2017   Zoster, Live 03/23/2016    TDAP status: Up to date  Flu Vaccine status: Declined, Education has been provided regarding the importance of this vaccine but patient still declined. Advised may receive this vaccine at local pharmacy or Health Dept. Aware to provide a copy of the vaccination record if obtained from local pharmacy or Health Dept. Verbalized acceptance and understanding.  Pneumococcal vaccine status: Declined,   Education has been provided regarding the importance of this vaccine but patient still declined. Advised may receive this vaccine at local pharmacy or Health Dept. Aware to provide a copy of the vaccination record if obtained from local pharmacy or Health Dept.  Verbalized acceptance and understanding.   Covid-19 vaccine status: Declined, Education has been provided regarding the importance of this vaccine but patient still declined. Advised may receive this vaccine at local pharmacy or Health Dept.or vaccine clinic. Aware to provide a copy of the vaccination record if obtained from local pharmacy or Health Dept. Verbalized acceptance and understanding.  Qualifies for Shingles Vaccine? Yes   Zostavax completed No   Shingrix Completed?: No.    Education has been provided regarding the importance of this vaccine. Patient has been advised to call insurance company to determine out of pocket expense if they have not yet received this vaccine. Advised may also receive vaccine at local pharmacy or Health Dept. Verbalized acceptance and understanding.  Screening Tests Health Maintenance  Topic Date Due   COVID-19 Vaccine (1) Never done   Zoster Vaccines- Shingrix (1 of 2) Never done   INFLUENZA VACCINE  05/03/2021   COLONOSCOPY (Pts 45-32yr Insurance coverage will need to be confirmed)  03/02/2022   TETANUS/TDAP  04/26/2027   Hepatitis C Screening  Completed   HIV Screening  Completed   HPV VACCINES  Aged Out    Health Maintenance  Health Maintenance Due  Topic Date Due   COVID-19 Vaccine (1) Never done   Zoster Vaccines- Shingrix (1 of 2) Never done   INFLUENZA VACCINE  05/03/2021    Colorectal cancer screening: Type of screening: Colonoscopy. Completed 03/02/17. Repeat every 5 years  Lung Cancer Screening: (Low Dose CT Chest recommended if Age 66-80years, 30 pack-year currently smoking OR have quit w/in 15years.) does not qualify.   Lung Cancer Screening Referral: n/a  Additional  Screening:  Hepatitis C Screening: does not qualify; Completed n/a  Vision Screening: Recommended annual ophthalmology exams for early detection of glaucoma and other disorders of the eye. Is the patient up to date with their annual eye exam?  No  Who is the provider or what is the name of the office in which the patient attends annual eye exams? None at this time.  If pt is not established with a provider, would they like to be referred to a provider to establish care? No .   Dental Screening: Recommended annual dental exams for proper oral hygiene  Community Resource Referral / Chronic Care Management: CRR required this visit?  No   CCM required this visit?  No      Plan:     I have personally reviewed and noted the following in the patient's chart:   Medical and social history Use of alcohol, tobacco or illicit drugs  Current medications and supplements including opioid prescriptions. Patient is not currently taking opioid prescriptions. Functional ability and status Nutritional status Physical activity Advanced directives List of other physicians Hospitalizations, surgeries, and ER visits in previous 12 months Vitals Screenings to include cognitive, depression, and falls Referrals and appointments  In addition, I have reviewed and discussed with patient certain preventive protocols, quality metrics, and best practice recommendations. A written personalized care plan for preventive services as well as general preventive health recommendations were provided to patient.     BLaretta Bolster [place]  [phone]  Nurse Notes: AWV conducted by nurse in office. Patient gave consent to telehealth visit. Patient and provider present in office today for this visit. Visit took 30 minutes to complete.

## 2021-07-28 ENCOUNTER — Encounter (HOSPITAL_COMMUNITY): Payer: Self-pay

## 2021-07-28 NOTE — Progress Notes (Signed)
Patient notified of LDCT Lung Cancer Screening Results via mail with the recommendation to follow-up in 12 months. Patient's referring provider has been sent a copy of results. Results are as follows:   IMPRESSION: 1. Lung-RADS 2, benign appearance or behavior. Continue annual screening with low-dose chest CT without contrast in 12 months. 2. One vessel coronary atherosclerosis. 3. Aortic Atherosclerosis (ICD10-I70.0) and Emphysema (ICD10-J43.9).

## 2021-09-21 ENCOUNTER — Other Ambulatory Visit: Payer: Self-pay | Admitting: Family Medicine

## 2021-09-22 ENCOUNTER — Encounter: Payer: Self-pay | Admitting: Family Medicine

## 2021-09-22 ENCOUNTER — Other Ambulatory Visit: Payer: Self-pay

## 2021-09-22 ENCOUNTER — Ambulatory Visit (INDEPENDENT_AMBULATORY_CARE_PROVIDER_SITE_OTHER): Payer: Medicare Other | Admitting: Family Medicine

## 2021-09-22 VITALS — BP 128/78 | HR 70 | Resp 17 | Ht 70.0 in | Wt 175.0 lb

## 2021-09-22 DIAGNOSIS — I10 Essential (primary) hypertension: Secondary | ICD-10-CM | POA: Diagnosis not present

## 2021-09-22 DIAGNOSIS — Z87891 Personal history of nicotine dependence: Secondary | ICD-10-CM

## 2021-09-22 DIAGNOSIS — E785 Hyperlipidemia, unspecified: Secondary | ICD-10-CM

## 2021-09-22 DIAGNOSIS — Z1211 Encounter for screening for malignant neoplasm of colon: Secondary | ICD-10-CM | POA: Diagnosis not present

## 2021-09-22 DIAGNOSIS — F411 Generalized anxiety disorder: Secondary | ICD-10-CM | POA: Diagnosis not present

## 2021-09-22 MED ORDER — PANTOPRAZOLE SODIUM 40 MG PO TBEC: 40.0000 mg | DELAYED_RELEASE_TABLET | Freq: Every day | ORAL | 3 refills | Status: DC

## 2021-09-22 MED ORDER — ALPRAZOLAM 1 MG PO TABS: ORAL_TABLET | ORAL | 0 refills | Status: DC

## 2021-09-22 NOTE — Assessment & Plan Note (Signed)
Hyperlipidemia:Low fat diet discussed and encouraged.   Lipid Panel  Lab Results  Component Value Date   CHOL 210 (H) 03/23/2021   HDL 66 03/23/2021   LDLCALC 132 (H) 03/23/2021   TRIG 68 03/23/2021   CHOLHDL 3.2 03/23/2021     Uncontrolled Updated lab today

## 2021-09-22 NOTE — Assessment & Plan Note (Signed)
Annual chest scan, up to date

## 2021-09-22 NOTE — Assessment & Plan Note (Signed)
Slight increase in anxiety with Mom's passing, limited xanax added, continue daily paxil, no therapy desired or indicated currently

## 2021-09-22 NOTE — Assessment & Plan Note (Signed)
DASH diet and commitment to daily physical activity for a minimum of 30 minutes discussed and encouraged, as a part of hypertension management. The importance of attaining a healthy weight is also discussed.  BP/Weight 09/22/2021 06/21/2021 03/23/2021 08/05/2020 03/31/2020 05/14/2019 0/62/3762  Systolic BP 831 517 616 073 710 626 948  Diastolic BP 78 85 79 82 82 70 80  Wt. (Lbs) 175.04 175 169 163 164 165 168  BMI 25.12 25.11 24.96 23.39 23.53 23.68 24.11

## 2021-09-22 NOTE — Progress Notes (Signed)
° °  [name]     MRN: [id number]      DOB: 07-Nov-1955   HPI Eugene Acevedo is here for follow up and re-evaluation of chronic medical conditions, medication management and review of any available recent lab and radiology data.  Preventive health is updated, specifically  Cancer screening and Immunization.   Lost Mother to cancer unknown prmariy in the Summer, coping with grief fairly well   ROS Denies recent fever or chills. Denies sinus pressure, nasal congestion, ear pain or sore throat. Denies chest congestion, productive cough or wheezing. Denies chest pains, palpitations and leg swelling Denies abdominal pain, nausea, vomiting,diarrhea or constipation.   Denies dysuria, frequency, hesitancy or incontinence. Denies uncontrolled joint pain, swelling and limitation in mobility. Denies headaches, seizures, numbness, or tingling. Denies depression c/o increased anxiety at times, and mild insomnia insomnia. Denies skin break down or rash.   PE  BP 128/78    Pulse 70    Resp 17    Ht 5\' 10"  (1.778 m)    Wt 175 lb 0.6 oz (79.4 kg)    SpO2 96%    BMI 25.12 kg/m    Patient alert and oriented and in no cardiopulmonary distress.  HEENT: No facial asymmetry, EOMI,     Neck supple .  Chest: Clear to auscultation bilaterally.  CVS: S1, S2 no murmurs, no S3.Regular rate.  ABD: Soft non tender.   Ext: No edema  MS: Adequate ROM spine, shoulders, hips and knees.  Skin: Intact, no ulcerations or rash noted.  Psych: Good eye contact, normal affect. Memory intact mildly anxious not  depressed appearing.  CNS: CN 2-12 intact, power,  normal throughout.no focal deficits noted.   Assessment & Plan  Essential hypertension DASH diet and commitment to daily physical activity for a minimum of 30 minutes discussed and encouraged, as a part of hypertension management. The importance of attaining a healthy weight is also discussed.  BP/Weight 09/22/2021 06/21/2021 03/23/2021 08/05/2020  03/31/2020 05/14/2019 0/16/0109  Systolic BP 323 557 322 025 427 062 376  Diastolic BP 78 85 79 82 82 70 80  Wt. (Lbs) 175.04 175 169 163 164 165 168  BMI 25.12 25.11 24.96 23.39 23.53 23.68 24.11       GAD (generalized anxiety disorder) Slight increase in anxiety with Mom's passing, limited xanax added, continue daily paxil, no therapy desired or indicated currently  Hyperlipidemia LDL goal <100 Hyperlipidemia:Low fat diet discussed and encouraged.   Lipid Panel  Lab Results  Component Value Date   CHOL 210 (H) 03/23/2021   HDL 66 03/23/2021   LDLCALC 132 (H) 03/23/2021   TRIG 68 03/23/2021   CHOLHDL 3.2 03/23/2021     Uncontrolled Updated lab today  History of smoking 30 or more pack years Annual chest scan, up to date

## 2021-09-22 NOTE — Patient Instructions (Signed)
Annual physical exam 6/22 or after, callif you need me sooner  Medicare wellness exam to be scheduled  Lipid, cmp and eGFR today  You are referred for colonscopy due in May  It is important that you exercise regularly at least 30 minutes 5 times a week. If you develop chest pain, have severe difficulty breathing, or feel very tired, stop exercising immediately and seek medical attention   Thanks for choosing [facility], we consider it a privelige to serve you.

## 2021-09-23 LAB — CMP14+EGFR
ALT: 17 IU/L (ref 0–44)
AST: 15 IU/L (ref 0–40)
Albumin/Globulin Ratio: 1.9 (ref 1.2–2.2)
Albumin: 4.7 g/dL (ref 3.8–4.8)
Alkaline Phosphatase: 107 IU/L (ref 44–121)
BUN/Creatinine Ratio: 15 (ref 10–24)
BUN: 13 mg/dL (ref 8–27)
Bilirubin Total: 0.5 mg/dL (ref 0.0–1.2)
CO2: 24 mmol/L (ref 20–29)
Calcium: 9.3 mg/dL (ref 8.6–10.2)
Chloride: 101 mmol/L (ref 96–106)
Creatinine, Ser: 0.89 mg/dL (ref 0.76–1.27)
Globulin, Total: 2.5 g/dL (ref 1.5–4.5)
Glucose: 99 mg/dL (ref 70–99)
Potassium: 4.5 mmol/L (ref 3.5–5.2)
Sodium: 138 mmol/L (ref 134–144)
Total Protein: 7.2 g/dL (ref 6.0–8.5)
eGFR: 95 mL/min/{1.73_m2} (ref >59)

## 2021-09-23 LAB — LIPID PANEL
Chol/HDL Ratio: 3.6 ratio (ref 0.0–5.0)
Cholesterol, Total: 216 mg/dL — ABNORMAL HIGH (ref 100–199)
HDL: 60 mg/dL (ref >39)
LDL Chol Calc (NIH): 137 mg/dL — ABNORMAL HIGH (ref 0–99)
Triglycerides: 107 mg/dL (ref 0–149)
VLDL Cholesterol Cal: 19 mg/dL (ref 5–40)

## 2021-09-30 ENCOUNTER — Telehealth: Payer: Self-pay | Admitting: Family Medicine

## 2021-09-30 ENCOUNTER — Other Ambulatory Visit: Payer: Self-pay

## 2021-09-30 MED ORDER — LISINOPRIL 20 MG PO TABS: 20.0000 mg | ORAL_TABLET | Freq: Every day | ORAL | 3 refills | Status: DC

## 2021-09-30 NOTE — Telephone Encounter (Signed)
Pt need refill on BP med

## 2021-09-30 NOTE — Telephone Encounter (Signed)
Refills sent

## 2021-10-17 ENCOUNTER — Other Ambulatory Visit: Payer: Self-pay | Admitting: Family Medicine

## 2021-10-17 DIAGNOSIS — M5441 Lumbago with sciatica, right side: Secondary | ICD-10-CM

## 2022-01-17 ENCOUNTER — Encounter: Payer: Self-pay | Admitting: Internal Medicine

## 2022-01-27 IMAGING — CT CT CHEST LUNG CANCER SCREENING LOW DOSE W/O CM
2 of 3 series · 15 of 36 positions shown, 18 images · non-contrast
Comparison: 11/28/2018 screening chest CT.

CLINICAL DATA: 64-year-old asymptomatic male former smoker with 41
pack-year smoking history, quit smoking 4 years prior.

EXAM:
CT CHEST WITHOUT CONTRAST LOW-DOSE FOR LUNG CANCER SCREENING
TECHNIQUE: Multidetector CT imaging of the chest was performed following the
standard protocol without IV contrast.

[Series 2: axial st · axial · 0.64mm/px · z∈[+1163,+1458]mm · 12 of 71 slices shown, 15 images]
[im 6/71  mediastinal]
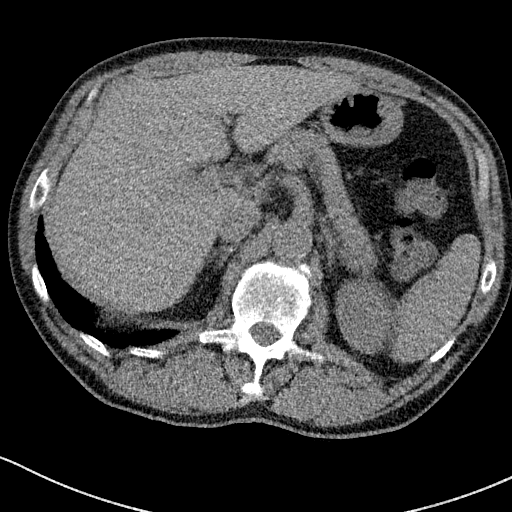
[im 6/71  lung]
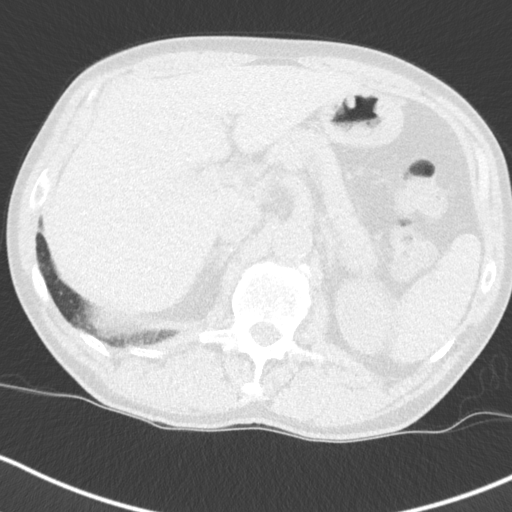
[im 11/71  lung]
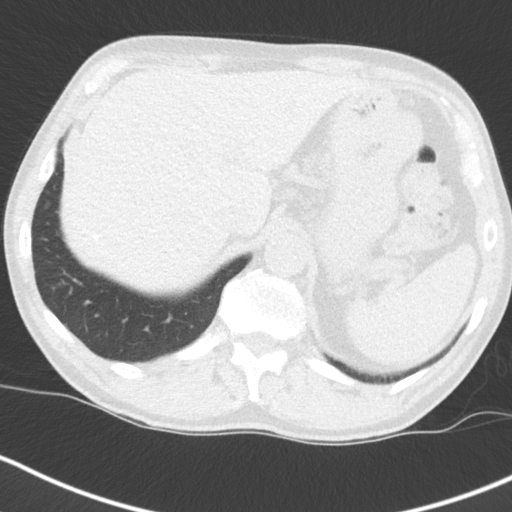
[im 16/71  lung]
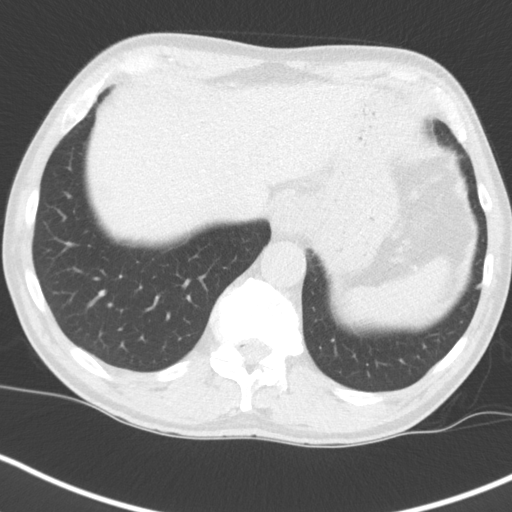
[im 21/71  lung]
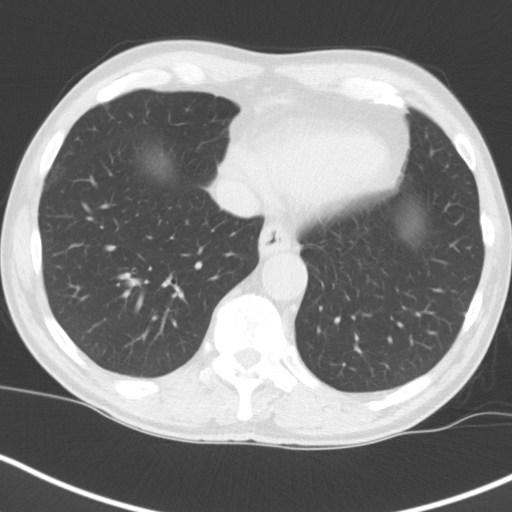
[im 26/71  mediastinal]
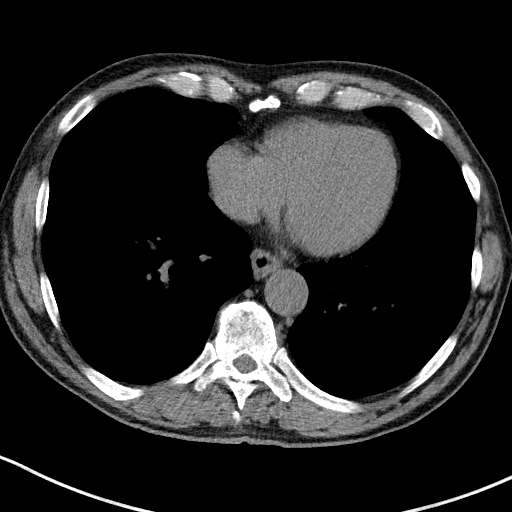
[im 26/71  lung]
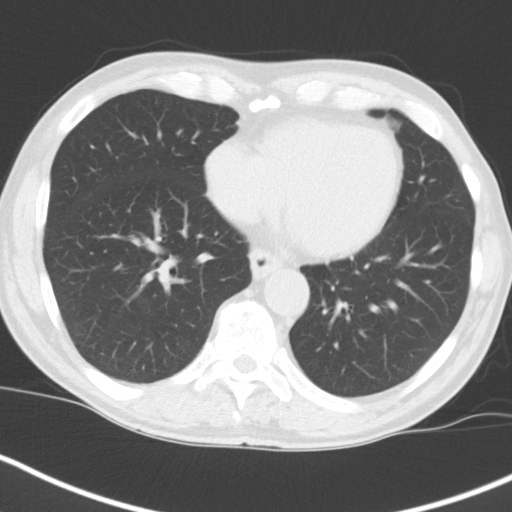
[im 32/71  lung]
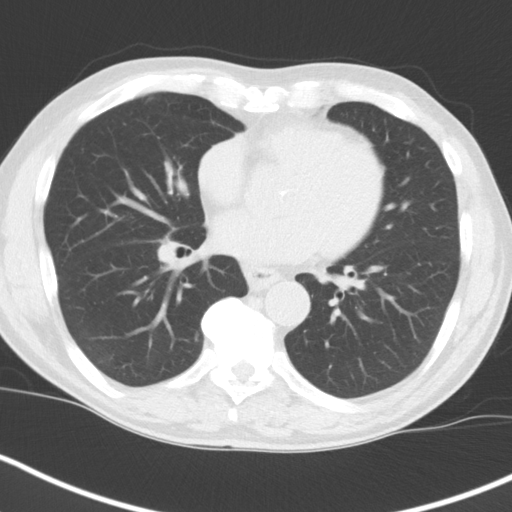
[im 39/71  lung]
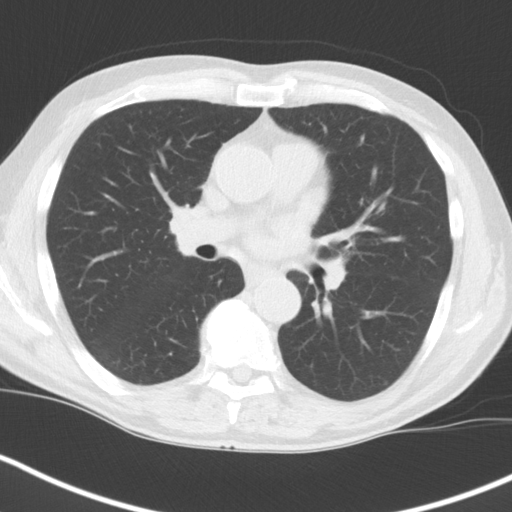
[im 45/71  lung]
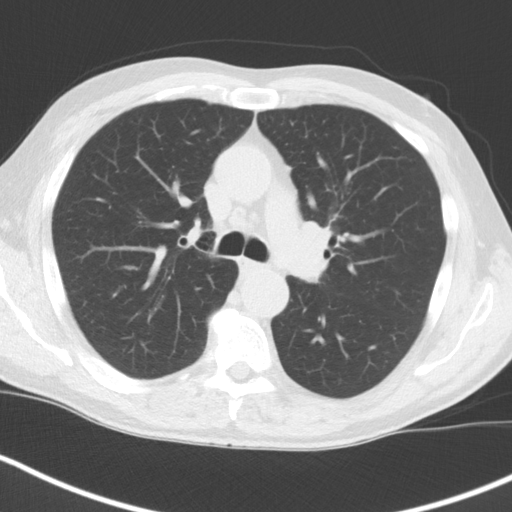
[im 50/71  mediastinal]
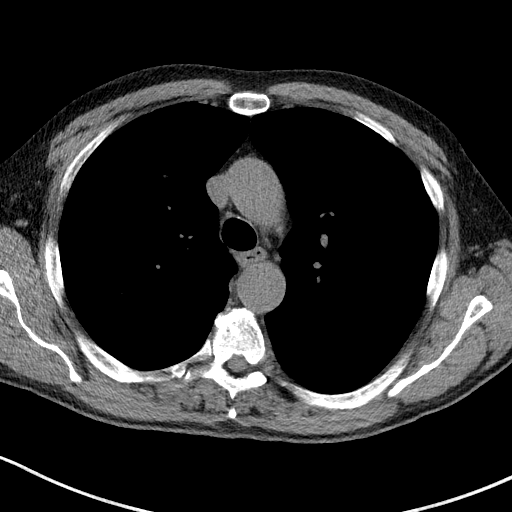
[im 50/71  lung]
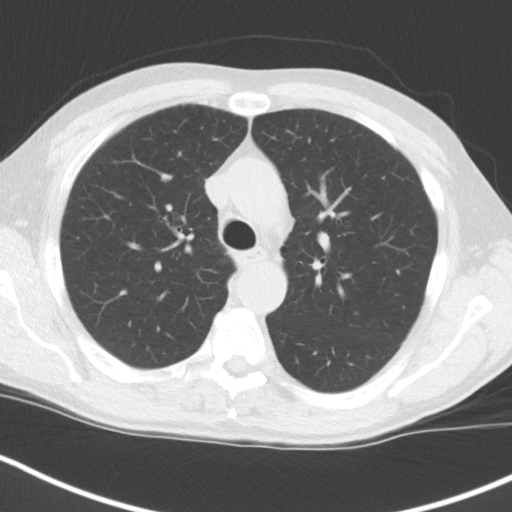
[im 55/71  lung]
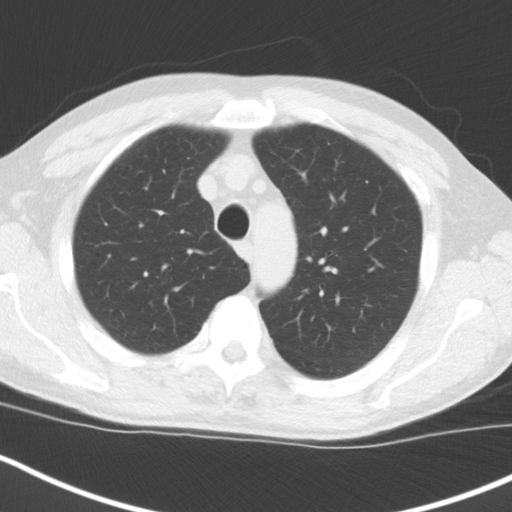
[im 60/71  lung]
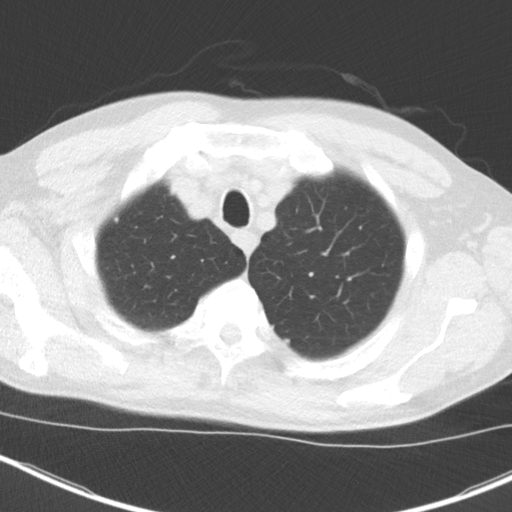
[im 65/71  lung]
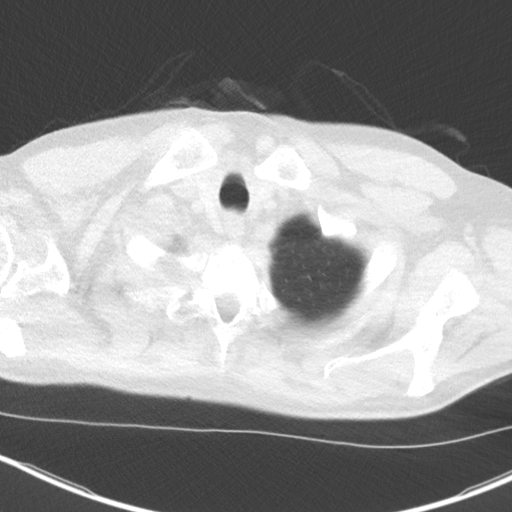

[Series 5: coronal · coronal · 0.72mm/px · 3 of 299 slices shown]
[im 60/299  lung]
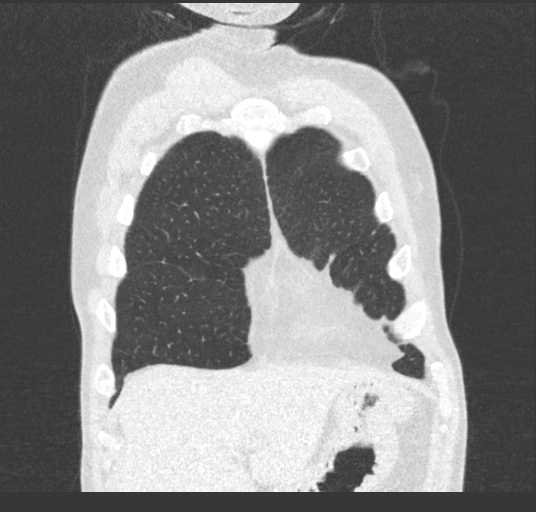
[im 120/299  lung]
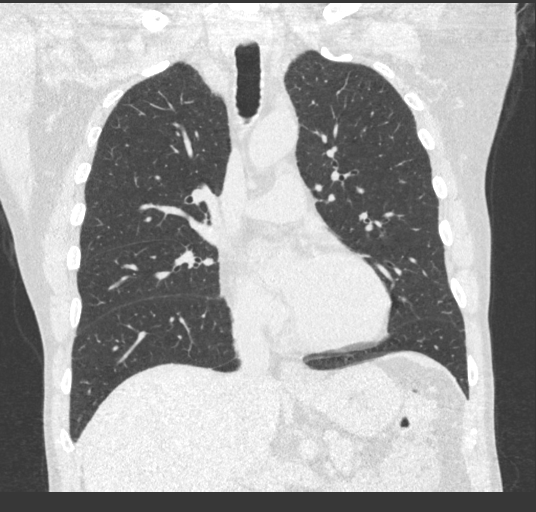
[im 179/299  lung]
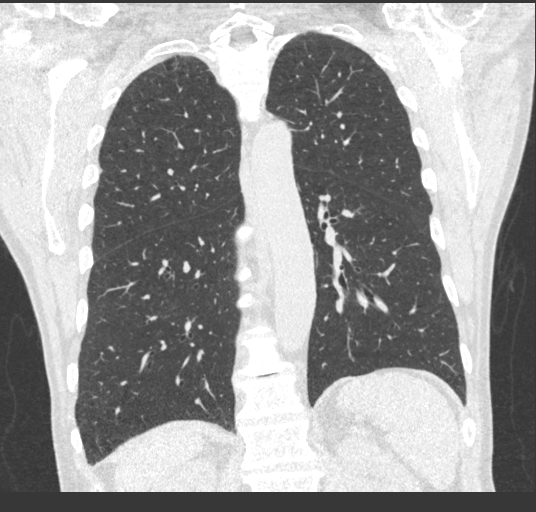

[15 of 36 positions shown; findings below may reference images not displayed]

FINDINGS: Please note that due to technical errors, this study could not be
processed on DynaCAD lung. All evaluation and measurements was
performed on Intelerad PACS.

Cardiovascular: Normal heart size. No significant pericardial
effusion/thickening. Left anterior descending coronary
atherosclerosis. Atherosclerotic nonaneurysmal thoracic aorta.
Normal caliber pulmonary arteries.

Mediastinum/Nodes: No discrete thyroid nodules. Unremarkable
esophagus. No pathologically enlarged axillary, mediastinal or hilar
lymph nodes, noting limited sensitivity for the detection of hilar
adenopathy on this noncontrast study.

Lungs/Pleura: No pneumothorax. No pleural effusion. Mild
centrilobular and paraseptal emphysema with mild diffuse bronchial
wall thickening. No acute consolidative airspace disease or lung
masses. Numerous (at least 12) solid pulmonary nodules scattered
throughout both lungs, largest 5 mm in the right middle lobe (series
4/image 246), all stable since 11/28/2018 chest CT. No new
significant pulmonary nodules.

Upper abdomen: No acute abnormality.

Musculoskeletal: No aggressive appearing focal osseous lesions.
Moderate thoracic spondylosis.
IMPRESSION: 1. Lung-RADS 2, benign appearance or behavior. Continue annual
screening with low-dose chest CT without contrast in 12 months.
2. One vessel coronary atherosclerosis.
3. Aortic Atherosclerosis (05SG2-3RA.A) and Emphysema (05SG2-3O0.M).

## 2022-02-01 ENCOUNTER — Telehealth: Payer: Self-pay | Admitting: *Deleted

## 2022-02-01 NOTE — Telephone Encounter (Signed)
Please review chart and advise if pt can be done in [place] ? ?

## 2022-02-02 NOTE — Telephone Encounter (Signed)
Sheila,  This pt is cleared for anesthetic care at [facility].  Thanks,  Curstin Schmale 

## 2022-02-07 DIAGNOSIS — N521 Erectile dysfunction due to diseases classified elsewhere: Secondary | ICD-10-CM | POA: Diagnosis not present

## 2022-02-07 DIAGNOSIS — N5201 Erectile dysfunction due to arterial insufficiency: Secondary | ICD-10-CM | POA: Diagnosis not present

## 2022-02-14 ENCOUNTER — Other Ambulatory Visit: Payer: Self-pay | Admitting: Family Medicine

## 2022-02-14 DIAGNOSIS — M5441 Lumbago with sciatica, right side: Secondary | ICD-10-CM

## 2022-02-22 ENCOUNTER — Ambulatory Visit (AMBULATORY_SURGERY_CENTER): Payer: Medicare Other | Admitting: *Deleted

## 2022-02-22 VITALS — Ht 70.0 in | Wt 174.0 lb

## 2022-02-22 DIAGNOSIS — Z86010 Personal history of colonic polyps: Secondary | ICD-10-CM

## 2022-02-22 MED ORDER — NA SULFATE-K SULFATE-MG SULF 17.5-3.13-1.6 GM/177ML PO SOLN: 1.0000 | Freq: Once | ORAL | 0 refills | Status: AC

## 2022-02-22 NOTE — Progress Notes (Signed)

## 2022-03-09 ENCOUNTER — Encounter: Payer: Self-pay | Admitting: Internal Medicine

## 2022-03-15 ENCOUNTER — Ambulatory Visit (AMBULATORY_SURGERY_CENTER): Payer: Medicare Other | Admitting: Internal Medicine

## 2022-03-15 ENCOUNTER — Encounter: Payer: Self-pay | Admitting: Internal Medicine

## 2022-03-15 VITALS — BP 122/71 | HR 54 | Temp 97.7°F | Resp 12 | Ht 70.0 in | Wt 174.0 lb

## 2022-03-15 DIAGNOSIS — F411 Generalized anxiety disorder: Secondary | ICD-10-CM | POA: Diagnosis not present

## 2022-03-15 DIAGNOSIS — Z86010 Personal history of colonic polyps: Secondary | ICD-10-CM | POA: Diagnosis not present

## 2022-03-15 DIAGNOSIS — Z09 Encounter for follow-up examination after completed treatment for conditions other than malignant neoplasm: Secondary | ICD-10-CM | POA: Diagnosis not present

## 2022-03-15 DIAGNOSIS — I10 Essential (primary) hypertension: Secondary | ICD-10-CM | POA: Diagnosis not present

## 2022-03-15 MED ORDER — SODIUM CHLORIDE 0.9 % IV SOLN: 500.0000 mL | Freq: Once | INTRAVENOUS | Status: DC

## 2022-03-15 NOTE — Progress Notes (Signed)
HISTORY OF PRESENT ILLNESS:  Eugene Acevedo is a 66 y.o. male with a history of multiple adenomatous colon polyps.  He presents today for surveillance colonoscopy.  Last examination 2018.  No complaints  REVIEW OF SYSTEMS:  All non-GI ROS negative. Past Medical History:  Diagnosis Date   Anxiety    Aortic atherosclerosis ([place])    CT 11/02/16   Arthritis    wrists   Complication of anesthesia    Difficult to awake   Diverticulitis of sigmoid colon    Erectile dysfunction    GAD (generalized anxiety disorder)    GERD (gastroesophageal reflux disease)    Hyperlipidemia    no meds now   Hypertension    Insomnia    Internal hemorrhoid    Nicotine addiction    Pulmonary nodules    small bilateral lungs CT 11/02/16   Ulcer     Past Surgical History:  Procedure Laterality Date   COLONOSCOPY  03/02/2017   INGUINAL HERNIA REPAIR Left 10/04/2017   Procedure: LEFT HERNIA REPAIR INGUINAL WITH MESH;  Surgeon: Ileana Roup, MD;  Location: [facility];  Service: General;  Laterality: Left;   INSERTION OF MESH Left 10/04/2017   Procedure: INSERTION OF MESH;  Surgeon: Ileana Roup, MD;  Location: [facility];  Service: General;  Laterality: Left;   POLYPECTOMY     UPPER GASTROINTESTINAL ENDOSCOPY      Social History Eugene Acevedo  reports that he quit smoking about 9 years ago. His smoking use included cigarettes. He has a 20.00 pack-year smoking history. He has been exposed to tobacco smoke. He has never used smokeless tobacco. He reports current alcohol use. He reports that he does not use drugs.  family history includes Hypertension in his father and mother.  No Known Allergies     PHYSICAL EXAMINATION:  Vital signs: BP 136/76   Pulse 69   Temp 97.7 F (36.5 C)   Ht '5\' 10"'$  (1.778 m)   Wt 174 lb (78.9 kg)   SpO2 98%   BMI 24.97 kg/m  General: Well-developed, well-nourished, no acute distress HEENT: Sclerae are anicteric, conjunctiva pink. Oral mucosa intact Lungs:  Clear Heart: Regular Abdomen: soft, nontender, nondistended, no obvious ascites, no peritoneal signs, normal bowel sounds. No organomegaly. Extremities: No edema Psychiatric: alert and oriented x3. Cooperative     ASSESSMENT:  History of multiple adenomatous colon polyps   PLAN:   Surveillance colonoscopy

## 2022-03-15 NOTE — Patient Instructions (Signed)
Please read handouts provided. Continue present medications. Repeat colonoscopy in 5 years for screening.   YOU HAD AN ENDOSCOPIC PROCEDURE TODAY AT [facility]:   Refer to the procedure report that was given to you for any specific questions about what was found during the examination.  If the procedure report does not answer your questions, please call your gastroenterologist to clarify.  If you requested that your care partner not be given the details of your procedure findings, then the procedure report has been included in a sealed envelope for you to review at your convenience later.  YOU SHOULD EXPECT: Some feelings of bloating in the abdomen. Passage of more gas than usual.  Walking can help get rid of the air that was put into your GI tract during the procedure and reduce the bloating. If you had a lower endoscopy (such as a colonoscopy or flexible sigmoidoscopy) you may notice spotting of blood in your stool or on the toilet paper. If you underwent a bowel prep for your procedure, you may not have a normal bowel movement for a few days.  Please Note:  You might notice some irritation and congestion in your nose or some drainage.  This is from the oxygen used during your procedure.  There is no need for concern and it should clear up in a day or so.  SYMPTOMS TO REPORT IMMEDIATELY:  Following lower endoscopy (colonoscopy or flexible sigmoidoscopy):  Excessive amounts of blood in the stool  Significant tenderness or worsening of abdominal pains  Swelling of the abdomen that is new, acute  Fever of 100F or higher  For urgent or emergent issues, a gastroenterologist can be reached at any hour by calling [phone]. Do not use MyChart messaging for urgent concerns.    DIET:  We do recommend a small meal at first, but then you may proceed to your regular diet.  Drink plenty of fluids but you should avoid alcoholic beverages for 24 hours.  ACTIVITY:  You should plan  to take it easy for the rest of today and you should NOT DRIVE or use heavy machinery until tomorrow (because of the sedation medicines used during the test).    FOLLOW UP: Our staff will call the number listed on your records 24-72 hours following your procedure to check on you and address any questions or concerns that you may have regarding the information given to you following your procedure. If we do not reach you, we will leave a message.  We will attempt to reach you two times.  During this call, we will ask if you have developed any symptoms of COVID 19. If you develop any symptoms (ie: fever, flu-like symptoms, shortness of breath, cough etc.) before then, please call [phone].  If you test positive for Covid 19 in the 2 weeks post procedure, please call and report this information to [place].    If any biopsies were taken you will be contacted by phone or by letter within the next 1-3 weeks.  Please call us at [phone] if you have not heard about the biopsies in 3 weeks.    SIGNATURES/CONFIDENTIALITY: You and/or your care partner have signed paperwork which will be entered into your electronic medical record.  These signatures attest to the fact that that the information above on your After Visit Summary has been reviewed and is understood.  Full responsibility of the confidentiality of this discharge information lies with you and/or your care-partner.

## 2022-03-15 NOTE — Progress Notes (Signed)
Vss nad trans to pacu °

## 2022-03-15 NOTE — Op Note (Signed)
Eugene Acevedo Patient Name: Eugene Acevedo Procedure Date: 03/15/2022 8:42 AM MRN: [id number] Endoscopist: Docia Chuck. Henrene Pastor , MD Age: 66 Referring MD:  Date of Birth: [birth year] Gender: Male Account #: [id number] Procedure:                Colonoscopy Indications:              High risk colon cancer surveillance: Personal                            history of multiple (3 or more) adenomas. Previous                            examinations 2008, 2013, 2018 Medicines:                Monitored Anesthesia Care Procedure:                Pre-Anesthesia Assessment:                           - Prior to the procedure, a History and Physical                            was performed, and patient medications and                            allergies were reviewed. The patient's tolerance of                            previous anesthesia was also reviewed. The risks                            and benefits of the procedure and the sedation                            options and risks were discussed with the patient.                            All questions were answered, and informed consent                            was obtained. Prior Anticoagulants: The patient has                            taken no previous anticoagulant or antiplatelet                            agents. ASA Grade Assessment: II - A patient with                            mild systemic disease. After reviewing the risks                            and benefits, the patient was deemed in  satisfactory condition to undergo the procedure.                           After obtaining informed consent, the colonoscope                            was passed under direct vision. Throughout the                            procedure, the patient's blood pressure, pulse, and                            oxygen saturations were monitored continuously. The                            CF [id number] #[id number] was introduced  through the anus                            and advanced to the the cecum, identified by                            appendiceal orifice and ileocecal valve. The                            ileocecal valve, appendiceal orifice, and rectum                            were photographed. The quality of the bowel                            preparation was excellent. The colonoscopy was                            performed without difficulty. The patient tolerated                            the procedure well. The bowel preparation used was                            SUPREP via split dose instruction. Scope In: 8:58:35 AM Scope Out: 9:10:03 AM Scope Withdrawal Time: 0 hours 9 minutes 43 seconds  Total Procedure Duration: 0 hours 11 minutes 28 seconds  Findings:                 Multiple diverticula were found in the sigmoid                            colon.                           Internal hemorrhoids were found during retroflexion.                           The exam was otherwise without abnormality on  direct and retroflexion views. Complications:            No immediate complications. Estimated blood loss:                            None. Estimated Blood Loss:     Estimated blood loss: none. Impression:               - Diverticulosis in the sigmoid colon.                           - Internal hemorrhoids.                           - The examination was otherwise normal on direct                            and retroflexion views.                           - No specimens collected. Recommendation:           - Repeat colonoscopy in 5 years for surveillance                            (personal history of multiple adenomas).                           - Patient has a contact number available for                            emergencies. The signs and symptoms of potential                            delayed complications were discussed with the                             patient. Return to normal activities tomorrow.                            Written discharge instructions were provided to the                            patient.                           - Resume previous diet.                           - Continue present medications. Docia Chuck. Henrene Pastor, MD 03/15/2022 9:15:09 AM This report has been signed electronically.

## 2022-03-16 ENCOUNTER — Telehealth: Payer: Self-pay

## 2022-03-16 NOTE — Telephone Encounter (Signed)
Called #[phone] and left a message we tried to reach pt for a follow up call. maw

## 2022-03-30 ENCOUNTER — Encounter: Payer: Self-pay | Admitting: Family Medicine

## 2022-03-30 ENCOUNTER — Ambulatory Visit (INDEPENDENT_AMBULATORY_CARE_PROVIDER_SITE_OTHER): Payer: Medicare Other | Admitting: Family Medicine

## 2022-03-30 VITALS — BP 146/80 | HR 68 | Resp 16 | Ht 70.0 in | Wt 171.1 lb

## 2022-03-30 DIAGNOSIS — E559 Vitamin D deficiency, unspecified: Secondary | ICD-10-CM | POA: Diagnosis not present

## 2022-03-30 DIAGNOSIS — E785 Hyperlipidemia, unspecified: Secondary | ICD-10-CM

## 2022-03-30 DIAGNOSIS — Z87891 Personal history of nicotine dependence: Secondary | ICD-10-CM

## 2022-03-30 DIAGNOSIS — Z0001 Encounter for general adult medical examination with abnormal findings: Secondary | ICD-10-CM | POA: Diagnosis not present

## 2022-03-30 DIAGNOSIS — L989 Disorder of the skin and subcutaneous tissue, unspecified: Secondary | ICD-10-CM

## 2022-03-30 DIAGNOSIS — I10 Essential (primary) hypertension: Secondary | ICD-10-CM | POA: Diagnosis not present

## 2022-03-30 DIAGNOSIS — R7302 Impaired glucose tolerance (oral): Secondary | ICD-10-CM | POA: Diagnosis not present

## 2022-03-30 MED ORDER — LISINOPRIL 30 MG PO TABS: 30.0000 mg | ORAL_TABLET | Freq: Every day | ORAL | 5 refills | Status: DC

## 2022-03-30 NOTE — Assessment & Plan Note (Signed)

## 2022-03-30 NOTE — Assessment & Plan Note (Signed)
Uncontrolled, inc lisinopril[ril to 30 mg daily DASH diet and commitment to daily physical activity for a minimum of 30 minutes discussed and encouraged, as a part of hypertension management. The importance of attaining a healthy weight is also discussed.     03/30/2022    8:13 AM 03/30/2022    8:00 AM 03/15/2022    9:34 AM 03/15/2022    9:24 AM 03/15/2022    9:14 AM 03/15/2022    9:10 AM 03/15/2022    9:05 AM  BP/Weight  Systolic BP 664 403 474 99 88 259 97  Diastolic BP 80 79 71 66 58 63 59  Wt. (Lbs)  171.12       BMI  24.55 kg/m2

## 2022-03-30 NOTE — Patient Instructions (Signed)
F/ U in 5 months, call if uou need me b before  Increased dose of BP medication , please start tomorrow,  lisinopril 30 mg daily, alreadyy at the pharmacy  You are referred for skin evaluation  Labs today, CBC, lipid panel, cmp and eGFr, TSH and vit D  Please schedule chest scan for lung cancer screening at checkout   It is important that you exercise regularly at least 30 minutes 5 times a week. If you develop chest pain, have severe difficulty breathing, or feel very tired, stop exercising immediately and seek medical attention   Thanks for choosing [facility], we consider it a privelige to serve you.

## 2022-03-30 NOTE — Progress Notes (Signed)
   Eugene Acevedo     MRN: [id number]      DOB: 18-Dec-1955   HPI: Patient is in for annual physical exam. Immunization is reviewed , and he refuses vaccines needed C/o 2 rough areas on left side of forehead and also left ear, has had s lesions frozen int he past. C/o feeling tired Uncontrpolled hypertension is addressed   PE; BP (!) 146/80   Pulse 68   Resp 16   Ht '5\' 10"'$  (1.778 m)   Wt 171 lb 1.9 oz (77.6 kg)   SpO2 96%   BMI 24.55 kg/m   Pleasant male, alert and oriented x 3, in no cardio-pulmonary distress. Afebrile. HEENT No facial trauma or asymetry. Sinuses non tender. EOMI External ears normal,  Neck: supple, no adenopathy,JVD or thyromegaly.No bruits.  Chest: Clear to ascultation bilaterally.No crackles or wheezes.decreased air entry Non tender to palpation  Cardiovascular system; Heart sounds normal,  S1 and  S2 ,no S3.  No murmur, or thrill. Apical beat not displaced Peripheral pulses normal.  Abdomen: Soft, non tender, no organomegaly or masses. No bruits. Bowel sounds normal. No guarding, tenderness or rebound.    Musculoskeletal exam: Full ROM of spine, hips , shoulders and knees. No deformity ,swelling or crepitus noted. No muscle wasting or atrophy.   Neurologic: Cranial nerves 2 to 12 intact. Power, tone ,sensation  normal throughout. No disturbance in gait. No tremor.  Skin: Intact, no ulceration, erythema . Scaling rash noted.on left forehead and left outer ear Pigmentation normal throughout  Psych; Normal mood and affect. Judgement and concentration normal   Assessment & Plan:  Annual visit for general adult medical examination with abnormal findings Annual exam as documented. Counseling done  re healthy lifestyle involving commitment to 150 minutes exercise per week, heart healthy diet, and attaining healthy weight.The importance of adequate sleep also discussed. Regular seat belt use and home safety, is also  discussed. Changes in health habits are decided on by the patient with goals and time frames  set for achieving them. Immunization and cancer screening needs are specifically addressed at this visit.   Skin lesions 2 month h/o rough area on face and ear , has had freezing done in the past, dermatology to eval and manage  Essential hypertension Uncontrolled, inc lisinopril[ril to 30 mg daily DASH diet and commitment to daily physical activity for a minimum of 30 minutes discussed and encouraged, as a part of hypertension management. The importance of attaining a healthy weight is also discussed.     03/30/2022    8:13 AM 03/30/2022    8:00 AM 03/15/2022    9:34 AM 03/15/2022    9:24 AM 03/15/2022    9:14 AM 03/15/2022    9:10 AM 03/15/2022    9:05 AM  BP/Weight  Systolic BP 315 945 859 99 88 292 97  Diastolic BP 80 79 71 66 58 63 59  Wt. (Lbs)  171.12       BMI  24.55 kg/m2

## 2022-03-30 NOTE — Assessment & Plan Note (Signed)
2 month h/o rough area on face and ear , has had freezing done in the past, dermatology to eval and manage

## 2022-03-31 LAB — CBC
Hematocrit: 46.4 % (ref 37.5–51.0)
Hemoglobin: 15.8 g/dL (ref 13.0–17.7)
MCH: 31.7 pg (ref 26.6–33.0)
MCHC: 34.1 g/dL (ref 31.5–35.7)
MCV: 93 fL (ref 79–97)
Platelets: 227 10*3/uL (ref 150–450)
RBC: 4.99 x10E6/uL (ref 4.14–5.80)
RDW: 12.9 % (ref 11.6–15.4)
WBC: 6.7 10*3/uL (ref 3.4–10.8)

## 2022-03-31 LAB — CMP14+EGFR
ALT: 13 IU/L (ref 0–44)
AST: 19 IU/L (ref 0–40)
Albumin/Globulin Ratio: 2 (ref 1.2–2.2)
Albumin: 4.9 g/dL — ABNORMAL HIGH (ref 3.8–4.8)
Alkaline Phosphatase: 109 IU/L (ref 44–121)
BUN/Creatinine Ratio: 12 (ref 10–24)
BUN: 11 mg/dL (ref 8–27)
Bilirubin Total: 0.5 mg/dL (ref 0.0–1.2)
CO2: 22 mmol/L (ref 20–29)
Calcium: 9.6 mg/dL (ref 8.6–10.2)
Chloride: 100 mmol/L (ref 96–106)
Creatinine, Ser: 0.91 mg/dL (ref 0.76–1.27)
Globulin, Total: 2.5 g/dL (ref 1.5–4.5)
Glucose: 110 mg/dL — ABNORMAL HIGH (ref 70–99)
Potassium: 5.4 mmol/L — ABNORMAL HIGH (ref 3.5–5.2)
Sodium: 139 mmol/L (ref 134–144)
Total Protein: 7.4 g/dL (ref 6.0–8.5)
eGFR: 93 mL/min/{1.73_m2} (ref >59)

## 2022-03-31 LAB — LIPID PANEL
Chol/HDL Ratio: 3.5 ratio (ref 0.0–5.0)
Cholesterol, Total: 219 mg/dL — ABNORMAL HIGH (ref 100–199)
HDL: 62 mg/dL (ref >39)
LDL Chol Calc (NIH): 145 mg/dL — ABNORMAL HIGH (ref 0–99)
Triglycerides: 68 mg/dL (ref 0–149)
VLDL Cholesterol Cal: 12 mg/dL (ref 5–40)

## 2022-03-31 LAB — TSH: TSH: 1.1 u[IU]/mL (ref 0.450–4.500)

## 2022-03-31 LAB — VITAMIN D 25 HYDROXY (VIT D DEFICIENCY, FRACTURES): Vit D, 25-Hydroxy: 32.2 ng/mL (ref 30.0–100.0)

## 2022-04-06 ENCOUNTER — Encounter: Payer: Self-pay | Admitting: Family Medicine

## 2022-04-07 ENCOUNTER — Encounter: Payer: Self-pay | Admitting: Family Medicine

## 2022-04-08 LAB — HGB A1C W/O EAG: Hgb A1c MFr Bld: 5.5 % (ref 4.8–5.6)

## 2022-04-08 LAB — SPECIMEN STATUS REPORT

## 2022-04-15 ENCOUNTER — Other Ambulatory Visit: Payer: Self-pay | Admitting: Family Medicine

## 2022-04-15 DIAGNOSIS — M5441 Lumbago with sciatica, right side: Secondary | ICD-10-CM

## 2022-04-25 ENCOUNTER — Ambulatory Visit (HOSPITAL_COMMUNITY): Admission: RE | Admit: 2022-04-25 | Payer: Medicare Other | Source: Ambulatory Visit

## 2022-04-28 DIAGNOSIS — X32XXXA Exposure to sunlight, initial encounter: Secondary | ICD-10-CM | POA: Diagnosis not present

## 2022-04-28 DIAGNOSIS — L570 Actinic keratosis: Secondary | ICD-10-CM | POA: Diagnosis not present

## 2022-04-28 DIAGNOSIS — C44319 Basal cell carcinoma of skin of other parts of face: Secondary | ICD-10-CM | POA: Diagnosis not present

## 2022-04-28 DIAGNOSIS — L720 Epidermal cyst: Secondary | ICD-10-CM | POA: Diagnosis not present

## 2022-06-03 ENCOUNTER — Telehealth: Payer: Self-pay

## 2022-06-03 DIAGNOSIS — H2513 Age-related nuclear cataract, bilateral: Secondary | ICD-10-CM | POA: Diagnosis not present

## 2022-06-03 DIAGNOSIS — H5203 Hypermetropia, bilateral: Secondary | ICD-10-CM | POA: Diagnosis not present

## 2022-06-03 NOTE — Telephone Encounter (Signed)
Left detailed msg for Dawn prior Josem Kaufmann is updated and documented

## 2022-06-03 NOTE — Telephone Encounter (Signed)
LVM to make them aware

## 2022-06-03 NOTE — Telephone Encounter (Signed)
Dawn called from [facility] health pre services patient CT Chest Lung Screening prior authorization expired 08.31.2023 needs an extension. Appt scheduled for 09.05.2023. Call back # [phone]

## 2022-06-07 ENCOUNTER — Ambulatory Visit (HOSPITAL_COMMUNITY): Payer: Medicare Other

## 2022-06-19 ENCOUNTER — Other Ambulatory Visit: Payer: Self-pay | Admitting: Family Medicine

## 2022-06-19 DIAGNOSIS — M5441 Lumbago with sciatica, right side: Secondary | ICD-10-CM

## 2022-06-22 ENCOUNTER — Ambulatory Visit: Payer: Medicare Other | Admitting: Family Medicine

## 2022-06-27 ENCOUNTER — Ambulatory Visit (INDEPENDENT_AMBULATORY_CARE_PROVIDER_SITE_OTHER): Payer: Medicare Other

## 2022-06-27 VITALS — BP 144/81 | HR 66 | Ht 70.0 in | Wt 171.1 lb

## 2022-06-27 DIAGNOSIS — Z0000 Encounter for general adult medical examination without abnormal findings: Secondary | ICD-10-CM | POA: Diagnosis not present

## 2022-06-27 NOTE — Progress Notes (Signed)
Subjective:   EVIAN SALGUERO is a 66 y.o. male who presents for Medicare Annual/Subsequent preventive examination.  Review of Systems     Mr. Blackwell , Thank you for taking time to come for your Medicare Wellness Visit. I appreciate your ongoing commitment to your health goals. Please review the following plan we discussed and let me know if I can assist you in the future.   These are the goals we discussed:  Goals   None     This is a list of the screening recommended for you and due dates:  Health Maintenance  Topic Date Due   COVID-19 Vaccine (1) Never done   Flu Shot  05/03/2022   Zoster (Shingles) Vaccine (1 of 2) 06/30/2022*   Colon Cancer Screening  03/16/2027   Tetanus Vaccine  04/26/2027   Pneumonia Vaccine  Completed   Hepatitis C Screening: USPSTF Recommendation to screen - Ages 18-79 yo.  Completed   HPV Vaccine  Aged Out  *Topic was postponed. The date shown is not the original due date.          Objective:    There were no vitals filed for this visit. There is no height or weight on file to calculate BMI.     06/21/2021    8:55 AM 09/29/2017    8:13 AM 07/14/2017   12:21 PM 02/16/2017    7:35 AM  Advanced Directives  Does Patient Have a Medical Advance Directive? No No No No  Would patient like information on creating a medical advance directive? No - Patient declined No - Patient declined      Current Medications (verified) Outpatient Encounter Medications as of 06/27/2022  Medication Sig   aspirin EC 81 MG tablet Take 1 tablet by mouth daily.   cholecalciferol (VITAMIN D) 1000 units tablet Take 1,000 Units by mouth daily.    Cyanocobalamin (VITAMIN B 12 PO) Take 1 tablet by mouth daily.    gabapentin (NEURONTIN) 100 MG capsule TAKE 1 CAPSULE(100 MG) BY MOUTH AT BEDTIME   lisinopril (ZESTRIL) 30 MG tablet Take 1 tablet (30 mg total) by mouth daily.   pantoprazole (PROTONIX) 40 MG tablet Take 1 tablet (40 mg total) by mouth daily.   PARoxetine  (PAXIL) 10 MG tablet TAKE 1 TABLET(10 MG) BY MOUTH DAILY   No facility-administered encounter medications on file as of 06/27/2022.    Allergies (verified) Patient has no known allergies.   History: Past Medical History:  Diagnosis Date   Anxiety    Aortic atherosclerosis ([place])    CT 11/02/16   Arthritis    wrists   Complication of anesthesia    Difficult to awake   Diverticulitis of sigmoid colon    Erectile dysfunction    GAD (generalized anxiety disorder)    GERD (gastroesophageal reflux disease)    Hyperlipidemia    no meds now   Hypertension    Insomnia    Internal hemorrhoid    Nicotine addiction    Pulmonary nodules    small bilateral lungs CT 11/02/16   Ulcer    Past Surgical History:  Procedure Laterality Date   COLONOSCOPY  03/02/2017   INGUINAL HERNIA REPAIR Left 10/04/2017   Procedure: LEFT HERNIA REPAIR INGUINAL WITH MESH;  Surgeon: Ileana Roup, MD;  Location: [facility];  Service: General;  Laterality: Left;   INSERTION OF MESH Left 10/04/2017   Procedure: INSERTION OF MESH;  Surgeon: Ileana Roup, MD;  Location: [facility];  Service: General;  Laterality: Left;   POLYPECTOMY     UPPER GASTROINTESTINAL ENDOSCOPY     Family History  Problem Relation Age of Onset   Hypertension Mother    Hypertension Father        DJD    Colon cancer Neg Hx    Esophageal cancer Neg Hx    Rectal cancer Neg Hx    Stomach cancer Neg Hx    Colon polyps Neg Hx    Crohn's disease Neg Hx    Social History   Socioeconomic History   Marital status: Married    Spouse name: Not on file   Number of children: Not on file   Years of education: Not on file   Highest education level: Not on file  Occupational History   Occupation: Dealer   Tobacco Use   Smoking status: Former    Packs/day: 0.50    Years: 40.00    Total pack years: 20.00    Types: Cigarettes    Quit date: 11/07/2012    Years since quitting: 9.6    Passive exposure: Past   Smokeless tobacco: Never   Vaping Use   Vaping Use: Never used  Substance and Sexual Activity   Alcohol use: Yes    Comment: 3 beers a day    Drug use: No   Sexual activity: Never  Other Topics Concern   Not on file  Social History Narrative   Not on file   Social Determinants of Health   Financial Resource Strain: Low Risk  (06/21/2021)   Overall Financial Resource Strain (CARDIA)    Difficulty of Paying Living Expenses: Not hard at all  Food Insecurity: No Food Insecurity (06/21/2021)   Hunger Vital Sign    Worried About Running Out of Food in the Last Year: Never true    Ran Out of Food in the Last Year: Never true  Transportation Needs: No Transportation Needs (06/21/2021)   PRAPARE - Hydrologist (Medical): No    Lack of Transportation (Non-Medical): No  Physical Activity: Insufficiently Active (06/21/2021)   Exercise Vital Sign    Days of Exercise per Week: 2 days    Minutes of Exercise per Session: 20 min  Stress: No Stress Concern Present (06/21/2021)   [place]    Feeling of Stress : Only a little  Social Connections: Socially Isolated (06/21/2021)   Social Connection and Isolation Panel [NHANES]    Frequency of Communication with Friends and Family: More than three times a week    Frequency of Social Gatherings with Friends and Family: Twice a week    Attends Religious Services: Never    Marine scientist or Organizations: No    Attends Archivist Meetings: Never    Marital Status: Widowed    Tobacco Counseling Counseling given: Not Answered   Clinical Intake:                 Diabetic?no         Activities of Daily Living     No data to display           Patient Care Team: Fayrene Helper, MD as PCP - General  Indicate any recent Medical Services you may have received from other than [facility] providers in the past year (date may be approximate).      Assessment:   This is a routine wellness examination for Christiano.  Hearing/Vision screen No results  found.  Dietary issues and exercise activities discussed:     Goals Addressed   None   Depression Screen    03/30/2022    8:01 AM 09/22/2021    7:57 AM 06/21/2021    8:52 AM 03/23/2021    8:03 AM 08/05/2020    1:35 PM 03/31/2020   10:05 AM 05/14/2019    8:02 AM  PHQ 2/9 Scores  PHQ - 2 Score 1 0 1 0 0 0 0    Fall Risk    03/30/2022    8:01 AM 09/22/2021    7:57 AM 06/21/2021    8:55 AM 03/23/2021    8:03 AM 08/05/2020    1:35 PM  Fall Risk   Falls in the past year? 0 0 0 0 0  Number falls in past yr: 0 0 0 0   Injury with Fall? 0 0 0 0   Risk for fall due to :   No Fall Risks No Fall Risks   Follow up   Falls evaluation completed Falls evaluation completed     FALL RISK PREVENTION PERTAINING TO THE HOME:  Any stairs in or around the home? Yes  If so, are there any without handrails? Yes  Home free of loose throw rugs in walkways, pet beds, electrical cords, etc? Yes  Adequate lighting in your home to reduce risk of falls? Yes   ASSISTIVE DEVICES UTILIZED TO PREVENT FALLS:  Life alert? No  Use of a cane, walker or w/c? No  Grab bars in the bathroom? No  Shower chair or bench in shower? No  Elevated toilet seat or a handicapped toilet? No           06/21/2021    8:56 AM  6CIT Screen  What Year? 0 points  What month? 0 points  What time? 0 points  Count back from 20 0 points  Months in reverse 0 points  Repeat phrase 0 points  Total Score 0 points    Immunizations Immunization History  Administered Date(s) Administered   Influenza Split 06/20/2014   Influenza Whole 07/12/2007   Influenza,inj,Quad PF,6+ Mos 09/23/2015, 09/07/2016   Influenza-Unspecified 05/03/2021   Pneumococcal Conjugate-13 09/03/2014   Pneumococcal Polysaccharide-23 03/23/2021   Td 11/09/2006   Tdap 04/25/2017   Zoster, Live 03/23/2016    TDAP status: Up to date  Flu Vaccine  status: Due, Education has been provided regarding the importance of this vaccine. Advised may receive this vaccine at local pharmacy or Health Dept. Aware to provide a copy of the vaccination record if obtained from local pharmacy or Health Dept. Verbalized acceptance and understanding.  Pneumococcal vaccine status: Up to date  Covid-19 vaccine status: Information provided on how to obtain vaccines.   Qualifies for Shingles Vaccine? Yes   Zostavax completed No   Shingrix Completed?: No.    Education has been provided regarding the importance of this vaccine. Patient has been advised to call insurance company to determine out of pocket expense if they have not yet received this vaccine. Advised may also receive vaccine at local pharmacy or Health Dept. Verbalized acceptance and understanding.  Screening Tests Health Maintenance  Topic Date Due   COVID-19 Vaccine (1) Never done   INFLUENZA VACCINE  05/03/2022   Zoster Vaccines- Shingrix (1 of 2) 06/30/2022 (Originally 07/06/2005)   COLONOSCOPY (Pts 45-16yr Insurance coverage will need to be confirmed)  03/16/2027   TETANUS/TDAP  04/26/2027   Pneumonia Vaccine 66 Years old  Completed   Hepatitis C Screening  Completed   HPV VACCINES  Aged Out    Health Maintenance  Health Maintenance Due  Topic Date Due   COVID-19 Vaccine (1) Never done   INFLUENZA VACCINE  05/03/2022    Colorectal cancer screening: Type of screening: Colonoscopy. Completed 03/15/22. Repeat every 5 years  Lung Cancer Screening: (Low Dose CT Chest recommended if Age 47-80 years, 30 pack-year currently smoking OR have quit w/in 15years.) does not qualify.   Lung Cancer Screening Referral: no  Additional Screening:  Hepatitis C Screening: does qualify; Completed 10/12/15  Vision Screening: Recommended annual ophthalmology exams for early detection of glaucoma and other disorders of the eye. Is the patient up to date with their annual eye exam?  Yes  Who is the  provider or what is the name of the office in which the patient attends annual eye exams? [facility] If pt is not established with a provider, would they like to be referred to a provider to establish care? No .   Dental Screening: Recommended annual dental exams for proper oral hygiene  Community Resource Referral / Chronic Care Management: CRR required this visit?  No   CCM required this visit?  No      Plan:     I have personally reviewed and noted the following in the patient's chart:   Medical and social history Use of alcohol, tobacco or illicit drugs  Current medications and supplements including opioid prescriptions. Patient is not currently taking opioid prescriptions. Functional ability and status Nutritional status Physical activity Advanced directives List of other physicians Hospitalizations, surgeries, and ER visits in previous 12 months Vitals Screenings to include cognitive, depression, and falls Referrals and appointments  In addition, I have reviewed and discussed with patient certain preventive protocols, quality metrics, and best practice recommendations. A written personalized care plan for preventive services as well as general preventive health recommendations were provided to patient.     Quentin Angst, [place]   06/27/2022

## 2022-06-27 NOTE — Patient Instructions (Signed)
  Mr. Eugene Acevedo , Thank you for taking time to come for your Medicare Wellness Visit. I appreciate your ongoing commitment to your health goals. Please review the following plan we discussed and let me know if I can assist you in the future.   These are the goals we discussed:  Goals      Patient Stated     work        This is a list of the screening recommended for you and due dates:  Health Maintenance  Topic Date Due   COVID-19 Vaccine (1) Never done   Flu Shot  05/03/2022   Zoster (Shingles) Vaccine (1 of 2) 06/30/2022*   Colon Cancer Screening  03/16/2027   Tetanus Vaccine  04/26/2027   Pneumonia Vaccine  Completed   Hepatitis C Screening: USPSTF Recommendation to screen - Ages 18-79 yo.  Completed   HPV Vaccine  Aged Out  *Topic was postponed. The date shown is not the original due date.

## 2022-07-08 ENCOUNTER — Telehealth: Payer: Self-pay

## 2022-07-08 NOTE — Telephone Encounter (Signed)
Dawn from [facility] [phone] ext  [phone]  for a chest CT scanning patient keeps canceling and rescheduling his appointment,  now needs a new prior authorization, it has expired.

## 2022-07-08 NOTE — Telephone Encounter (Signed)
This is the 3rd nd time the scan has had to be re authorized. Will cancel for now

## 2022-07-12 ENCOUNTER — Ambulatory Visit (HOSPITAL_COMMUNITY): Admission: RE | Admit: 2022-07-12 | Payer: Medicare Other | Source: Ambulatory Visit

## 2022-07-13 ENCOUNTER — Telehealth: Payer: Self-pay | Admitting: Family Medicine

## 2022-07-13 NOTE — Telephone Encounter (Signed)
Pt called stating they CT has not been approved by his insu and is needing a new appt set up?  Can you please contact patient?

## 2022-07-13 NOTE — Telephone Encounter (Signed)
Sent to mor[facility] ed[facility] to be scheduled.

## 2022-07-18 DIAGNOSIS — H2513 Age-related nuclear cataract, bilateral: Secondary | ICD-10-CM | POA: Diagnosis not present

## 2022-07-18 DIAGNOSIS — H2511 Age-related nuclear cataract, right eye: Secondary | ICD-10-CM | POA: Diagnosis not present

## 2022-07-18 DIAGNOSIS — I10 Essential (primary) hypertension: Secondary | ICD-10-CM | POA: Diagnosis not present

## 2022-08-22 ENCOUNTER — Other Ambulatory Visit: Payer: Self-pay | Admitting: Family Medicine

## 2022-08-22 DIAGNOSIS — M5441 Lumbago with sciatica, right side: Secondary | ICD-10-CM

## 2022-08-30 ENCOUNTER — Ambulatory Visit (INDEPENDENT_AMBULATORY_CARE_PROVIDER_SITE_OTHER): Payer: Medicare Other | Admitting: Family Medicine

## 2022-08-30 ENCOUNTER — Other Ambulatory Visit: Payer: Self-pay

## 2022-08-30 ENCOUNTER — Encounter: Payer: Self-pay | Admitting: Family Medicine

## 2022-08-30 VITALS — BP 120/72 | HR 77 | Ht 70.0 in | Wt 175.0 lb

## 2022-08-30 DIAGNOSIS — E559 Vitamin D deficiency, unspecified: Secondary | ICD-10-CM

## 2022-08-30 DIAGNOSIS — E785 Hyperlipidemia, unspecified: Secondary | ICD-10-CM | POA: Diagnosis not present

## 2022-08-30 DIAGNOSIS — Z87891 Personal history of nicotine dependence: Secondary | ICD-10-CM

## 2022-08-30 DIAGNOSIS — Z2821 Immunization not carried out because of patient refusal: Secondary | ICD-10-CM | POA: Insufficient documentation

## 2022-08-30 DIAGNOSIS — F411 Generalized anxiety disorder: Secondary | ICD-10-CM

## 2022-08-30 DIAGNOSIS — I10 Essential (primary) hypertension: Secondary | ICD-10-CM

## 2022-08-30 MED ORDER — ALPRAZOLAM 1 MG PO TABS: ORAL_TABLET | ORAL | 2 refills | Status: DC

## 2022-08-30 MED ORDER — PANTOPRAZOLE SODIUM 40 MG PO TBEC: 40.0000 mg | DELAYED_RELEASE_TABLET | Freq: Every day | ORAL | 3 refills | Status: DC

## 2022-08-30 MED ORDER — AZELASTINE HCL 0.1 % NA SOLN: 2.0000 | Freq: Two times a day (BID) | NASAL | 12 refills | Status: DC

## 2022-08-30 NOTE — Progress Notes (Signed)
   Eugene Acevedo     MRN: [id number]      DOB: June 03, 1956   HPI Eugene Acevedo is here for follow up and re-evaluation of chronic medical conditions, medication management and review of any available recent lab and radiology data.  Preventive health is updated, specifically  Cancer screening and Immunization.   Questions or concerns regarding consultations or procedures which the PT has had in the interim are  addressed. The PT denies any adverse reactions to current medications since the last visit.  C/o anxiety and difficulty sleeping requests resumption of bedtime xanax as needed, low dose Drinks 24 oz beer every night   ROS Denies recent fever or chills. Denies sinus pressure, nasal congestion, ear pain or sore throat. Denies chest congestion, productive cough or wheezing. Denies chest pains, palpitations and leg swelling Denies abdominal pain, nausea, vomiting,diarrhea or constipation.   Denies dysuria, frequency, hesitancy or incontinence. Denies joint pain, swelling and limitation in mobility. Denies headaches, seizures, numbness, or tingling. Denies depression,. Denies skin break down or rash.   PE  BP 120/72   Pulse 77   Ht '5\' 10"'$  (1.778 m)   Wt 175 lb (79.4 kg)   SpO2 95%   BMI 25.11 kg/m   Patient alert and oriented and in no cardiopulmonary distress.  HEENT: No facial asymmetry, EOMI,     Neck supple .  Chest: Clear to auscultation bilaterally.  CVS: S1, S2 no murmurs, no S3.Regular rate.  ABD: Soft non tender.   Ext: No edema  MS: Adequate ROM spine, shoulders, hips and knees.  Skin: Intact, no ulcerations or rash noted.  Psych: Good eye contact, normal affect. Memory intact not anxious or depressed appearing.  CNS: CN 2-12 intact, power,  normal throughout.no focal deficits noted.   Assessment & Plan  Essential hypertension Controlled, no change in medication DASH diet and commitment to daily physical activity for a minimum of 30 minutes  discussed and encouraged, as a part of hypertension management. The importance of attaining a healthy weight is also discussed.     08/30/2022    8:36 AM 08/30/2022    8:10 AM 08/30/2022    8:06 AM 06/27/2022    8:02 AM 03/30/2022    8:13 AM 03/30/2022    8:00 AM 03/15/2022    9:34 AM  BP/Weight  Systolic BP 937 902 409 735 329 924 268  Diastolic BP 72 78 78 81 80 79 71  Wt. (Lbs)   175 171.08  171.12   BMI   25.11 kg/m2 24.55 kg/m2  24.55 kg/m2        Hyperlipidemia LDL goal <100 Hyperlipidemia:Low fat diet discussed and encouraged.   Lipid Panel  Lab Results  Component Value Date   CHOL 219 (H) 03/30/2022   HDL 62 03/30/2022   LDLCALC 145 (H) 03/30/2022   TRIG 68 03/30/2022   CHOLHDL 3.5 03/30/2022     Updated lab needed at/ before next visit.; Needs to reduce dietary fat  Refused influenza vaccine Re educated and refuses vaccine  History of smoking 30 or more pack years Needs scan, hopefully will not cancel next appt Remains nicotine free  GAD (generalized anxiety disorder) Stopped paxil says no benefit,but requests low dose bedtime xanax for as needed use, 1 mg tabs, 15 to last 2 months is prescribed

## 2022-08-30 NOTE — Assessment & Plan Note (Signed)
Stopped paxil says no benefit,but requests low dose bedtime xanax for as needed use, 1 mg tabs, 15 to last 2 months is prescribed

## 2022-08-30 NOTE — Assessment & Plan Note (Signed)
Hyperlipidemia:Low fat diet discussed and encouraged.   Lipid Panel  Lab Results  Component Value Date   CHOL 219 (H) 03/30/2022   HDL 62 03/30/2022   LDLCALC 145 (H) 03/30/2022   TRIG 68 03/30/2022   CHOLHDL 3.5 03/30/2022     Updated lab needed at/ before next visit.; Needs to reduce dietary fat

## 2022-08-30 NOTE — Assessment & Plan Note (Signed)
Re educated and refuses vaccine

## 2022-08-30 NOTE — Assessment & Plan Note (Addendum)
Needs scan, hopefully will not cancel next appt Remains nicotine free

## 2022-08-30 NOTE — Assessment & Plan Note (Signed)
Controlled, no change in medication DASH diet and commitment to daily physical activity for a minimum of 30 minutes discussed and encouraged, as a part of hypertension management. The importance of attaining a healthy weight is also discussed.     08/30/2022    8:36 AM 08/30/2022    8:10 AM 08/30/2022    8:06 AM 06/27/2022    8:02 AM 03/30/2022    8:13 AM 03/30/2022    8:00 AM 03/15/2022    9:34 AM  BP/Weight  Systolic BP 612 548 323 468 873 730 816  Diastolic BP 72 78 78 81 80 79 71  Wt. (Lbs)   175 171.08  171.12   BMI   25.11 kg/m2 24.55 kg/m2  24.55 kg/m2

## 2022-08-30 NOTE — Patient Instructions (Signed)
Annual exam in June when due with MD, call if you need me sooner.  Please reduce fried and fatty foods as cholesterol is elevated and this increases your risk of heart disease.  Nurse please follow up on chest scan which was ordered, patient states he is having problems with authorization to get this done and he does want it done and needs it.  Blood pressure is excellent continue current medication.  Fasting CBC lipid CMP and EGFR TSH and vitamin D to be obtained 1 week before next appointment.  New is nasal spray to be used for allergy control Astelin.  New is Xanax half a tab half a tablet as needed for anxiety and sleep.  15 tablets to last 2 months.  It is important that you exercise regularly at least 30 minutes 5 times a week. If you develop chest pain, have severe difficulty breathing, or feel very tired, stop exercising immediately and seek medical attention  Thanks for choosing [facility], we consider it a privelige to serve you.

## 2022-09-21 ENCOUNTER — Other Ambulatory Visit: Payer: Self-pay | Admitting: Family Medicine

## 2022-10-02 ENCOUNTER — Other Ambulatory Visit: Payer: Self-pay | Admitting: Family Medicine

## 2022-10-05 ENCOUNTER — Other Ambulatory Visit: Payer: Self-pay

## 2022-10-05 DIAGNOSIS — Z87891 Personal history of nicotine dependence: Secondary | ICD-10-CM

## 2022-10-05 DIAGNOSIS — Z122 Encounter for screening for malignant neoplasm of respiratory organs: Secondary | ICD-10-CM

## 2022-10-05 NOTE — Progress Notes (Signed)
LDCT order placed per protocol. Scan scheduled for 03/18 at 0845 per patient request, scan has been scheduled for March.

## 2022-10-20 DIAGNOSIS — H2511 Age-related nuclear cataract, right eye: Secondary | ICD-10-CM | POA: Diagnosis not present

## 2022-10-21 DIAGNOSIS — H25042 Posterior subcapsular polar age-related cataract, left eye: Secondary | ICD-10-CM | POA: Diagnosis not present

## 2022-10-21 DIAGNOSIS — H2511 Age-related nuclear cataract, right eye: Secondary | ICD-10-CM | POA: Diagnosis not present

## 2022-10-21 DIAGNOSIS — H25012 Cortical age-related cataract, left eye: Secondary | ICD-10-CM | POA: Diagnosis not present

## 2022-10-21 DIAGNOSIS — H2512 Age-related nuclear cataract, left eye: Secondary | ICD-10-CM | POA: Diagnosis not present

## 2022-11-10 DIAGNOSIS — H2512 Age-related nuclear cataract, left eye: Secondary | ICD-10-CM | POA: Diagnosis not present

## 2022-11-10 DIAGNOSIS — H52202 Unspecified astigmatism, left eye: Secondary | ICD-10-CM | POA: Diagnosis not present

## 2022-11-11 DIAGNOSIS — H2512 Age-related nuclear cataract, left eye: Secondary | ICD-10-CM | POA: Diagnosis not present

## 2022-11-20 ENCOUNTER — Other Ambulatory Visit: Payer: Self-pay | Admitting: Family Medicine

## 2022-11-20 DIAGNOSIS — M5441 Lumbago with sciatica, right side: Secondary | ICD-10-CM

## 2022-11-27 ENCOUNTER — Other Ambulatory Visit: Payer: Self-pay | Admitting: Family Medicine

## 2022-11-27 DIAGNOSIS — M5441 Lumbago with sciatica, right side: Secondary | ICD-10-CM

## 2022-12-19 ENCOUNTER — Ambulatory Visit (HOSPITAL_COMMUNITY)
Admission: RE | Admit: 2022-12-19 | Discharge: 2022-12-19 | Disposition: A | Discharge status: Home / Self Care | Payer: Medicare Other | Source: Ambulatory Visit | Attending: Physician Assistant | Admitting: Physician Assistant

## 2022-12-19 DIAGNOSIS — Z87891 Personal history of nicotine dependence: Secondary | ICD-10-CM | POA: Diagnosis not present

## 2022-12-19 DIAGNOSIS — Z122 Encounter for screening for malignant neoplasm of respiratory organs: Secondary | ICD-10-CM | POA: Diagnosis not present

## 2023-01-06 NOTE — Progress Notes (Signed)
Patient notified of LDCT Lung Cancer Screening Results via mail with the recommendation to follow-up in 12 months. Patient's referring provider has been sent a copy of results. Results are as follows:  IMPRESSION: 1. Lung-RADS 2S, benign appearance or behavior. Continue annual screening with low-dose chest CT without contrast in 12 months. 2. The "S" modifier above refers to potentially clinically significant non lung cancer related findings. Specifically, there is aortic atherosclerosis, in addition to left anterior descending coronary artery disease. Please note that although the presence of coronary artery calcium documents the presence of coronary artery disease, the severity of this disease and any potential stenosis cannot be assessed on this non-gated CT examination. Assessment for potential risk factor modification, dietary therapy or pharmacologic therapy may be warranted, if clinically indicated. 3. Mild diffuse bronchial wall thickening with mild centrilobular and paraseptal emphysema; imaging findings suggestive of underlying COPD. 4. There are calcifications of the aortic valve. Echocardiographic correlation for evaluation of potential valvular dysfunction may be warranted if clinically indicated.   Aortic Atherosclerosis (ICD10-I70.0) and Emphysema (ICD10-J43.9).

## 2023-02-11 DIAGNOSIS — H109 Unspecified conjunctivitis: Secondary | ICD-10-CM | POA: Diagnosis not present

## 2023-02-13 DIAGNOSIS — Z125 Encounter for screening for malignant neoplasm of prostate: Secondary | ICD-10-CM | POA: Diagnosis not present

## 2023-03-20 ENCOUNTER — Other Ambulatory Visit: Payer: Self-pay | Admitting: Family Medicine

## 2023-04-02 ENCOUNTER — Other Ambulatory Visit: Payer: Self-pay | Admitting: Family Medicine

## 2023-04-02 DIAGNOSIS — F411 Generalized anxiety disorder: Secondary | ICD-10-CM

## 2023-04-11 ENCOUNTER — Encounter: Payer: Self-pay | Admitting: Family Medicine

## 2023-04-11 ENCOUNTER — Ambulatory Visit (INDEPENDENT_AMBULATORY_CARE_PROVIDER_SITE_OTHER): Payer: Medicare Other | Admitting: Family Medicine

## 2023-04-11 VITALS — BP 122/76 | HR 93 | Resp 16 | Ht 70.0 in | Wt 167.0 lb

## 2023-04-11 DIAGNOSIS — Z0001 Encounter for general adult medical examination with abnormal findings: Secondary | ICD-10-CM

## 2023-04-11 DIAGNOSIS — E559 Vitamin D deficiency, unspecified: Secondary | ICD-10-CM

## 2023-04-11 DIAGNOSIS — E785 Hyperlipidemia, unspecified: Secondary | ICD-10-CM | POA: Diagnosis not present

## 2023-04-11 DIAGNOSIS — S60412A Abrasion of right middle finger, initial encounter: Secondary | ICD-10-CM

## 2023-04-11 DIAGNOSIS — I10 Essential (primary) hypertension: Secondary | ICD-10-CM | POA: Diagnosis not present

## 2023-04-11 MED ORDER — ALPRAZOLAM 1 MG PO TABS: 1.0000 mg | ORAL_TABLET | Freq: Every evening | ORAL | 0 refills | Status: DC | PRN

## 2023-04-11 MED ORDER — CEPHALEXIN 500 MG PO CAPS: 500.0000 mg | ORAL_CAPSULE | Freq: Two times a day (BID) | ORAL | 0 refills | Status: DC

## 2023-04-11 NOTE — Assessment & Plan Note (Signed)
Keflex x 5 days prescribed, continuen to keep clean, TdAP is up to date

## 2023-04-11 NOTE — Assessment & Plan Note (Signed)
Annual exam as documented. Counseling done  re healthy lifestyle involving commitment to 150 minutes exercise per week, heart healthy diet, and attaining healthy weight.The importance of adequate sleep also discussed.  Immunization and cancer screening needs are specifically addressed at this visit.  

## 2023-04-11 NOTE — Patient Instructions (Signed)
Follow-up in 6 months, call if you need me sooner.  Labs today CBC lipid panel TSH vitamin D CMP and EGFR.  Please commit to aspirin 81 mg daily.  Continue medications as you are currently taking them.  Make and decided efforts to take breaks even 2 times per month for 1 to 2 days just to have.  Please reconsider vaccines they do help to prevent serious illness.  5-day course of medication is prescribed for the abrasion on your finger.  It is important that you exercise regularly at least 30 minutes 5 times a week. If you develop chest pain, have severe difficulty breathing, or feel very tired, stop exercising immediately and seek medical attention   Think about what you will eat, plan ahead. Choose " clean, green, fresh or frozen" over canned, processed or packaged foods which are more sugary, salty and fatty. 70 to 75% of food eaten should be vegetables and fruit. Three meals at set times with snacks allowed between meals, but they must be fruit or vegetables. Aim to eat over a 12 hour period , example 7 am to 7 pm, and STOP after  your last meal of the day. Drink water,generally about 64 ounces per day, no other drink is as healthy. Fruit juice is best enjoyed in a healthy way, by EATING the fruit. Thanks for choosing [facility], we consider it a privelige to serve you.

## 2023-04-11 NOTE — Progress Notes (Signed)
   Eugene Acevedo     MRN: [id number]      DOB: November 19, 1955  Chief Complaint  Patient presents with   Annual Exam   Hand Burn    Burnt his hand with a torch while working and its slowly healing     HPI: Patient is in for annual physical exam. Burn to right 3rd finger 10 days ago, healing but still a lot of open skin Labs updated today Immunization is reviewed , and  refuses recommended updates, re educated also   PE; BP 122/76   Pulse 93   Resp 16   Ht 5\' 10"  (1.778 m)   Wt 167 lb (75.8 kg)   SpO2 94%   BMI 23.96 kg/m   Pleasant male, alert and oriented x 3, in no cardio-pulmonary distress. Afebrile. HEENT No facial trauma or asymetry. Sinuses non tender. EOMI External ears normal, Partial right  cerumen impaction Neck: supple, no adenopathy,JVD or thyromegaly.No bruits.  Chest: Clear to ascultation bilaterally.No crackles or wheezes.decreased air entry Non tender to palpation  Cardiovascular system; Heart sounds normal,  S1 and  S2 ,no S3.  No murmur, or thrill. Apical beat not displaced Peripheral pulses normal.  Abdomen: Soft, non tender, no organomegaly or masses. No bruits. Bowel sounds normal. No guarding, tenderness or rebound.    Musculoskeletal exam: Full ROM of spine, hips , shoulders and knees. No deformity ,swelling or crepitus noted. No muscle wasting or atrophy.   Neurologic: Cranial nerves 2 to 12 intact. Power, tone ,sensation and reflexes normal throughout. No disturbance in gait. No tremor.  Skin: abrasion approx 2.5 cm long and 1.0 cm wide to right 3rd finget , dorsal aspect. Pigmentation normal throughout  Psych; Normal mood and affect. Judgement and concentration normal   Assessment & Plan:  Annual visit for general adult medical examination with abnormal findings Annual exam as documented. Counseling done  re healthy lifestyle involving commitment to 150 minutes exercise per week, heart healthy diet, and attaining healthy  weight.The importance of adequate sleep also discussed. . Immunization and cancer screening needs are specifically addressed at this visit.   Abrasion of right middle finger Keflex x 5 days prescribed, continuen to keep clean, TdAP is up to date

## 2023-04-12 ENCOUNTER — Other Ambulatory Visit: Payer: Self-pay

## 2023-04-12 DIAGNOSIS — I10 Essential (primary) hypertension: Secondary | ICD-10-CM

## 2023-04-12 DIAGNOSIS — E785 Hyperlipidemia, unspecified: Secondary | ICD-10-CM

## 2023-04-12 LAB — CBC WITH DIFFERENTIAL/PLATELET
Basophils Absolute: 0.1 10*3/uL (ref 0.0–0.2)
Basos: 2 %
EOS (ABSOLUTE): 0.3 10*3/uL (ref 0.0–0.4)
Eos: 4 %
Hematocrit: 48.3 % (ref 37.5–51.0)
Hemoglobin: 15.9 g/dL (ref 13.0–17.7)
Immature Grans (Abs): 0 10*3/uL (ref 0.0–0.1)
Immature Granulocytes: 0 %
Lymphocytes Absolute: 1.9 10*3/uL (ref 0.7–3.1)
Lymphs: 24 %
MCH: 31 pg (ref 26.6–33.0)
MCHC: 32.9 g/dL (ref 31.5–35.7)
MCV: 94 fL (ref 79–97)
Monocytes Absolute: 0.6 10*3/uL (ref 0.1–0.9)
Monocytes: 7 %
Neutrophils Absolute: 5 10*3/uL (ref 1.4–7.0)
Neutrophils: 63 %
Platelets: 248 10*3/uL (ref 150–450)
RBC: 5.13 x10E6/uL (ref 4.14–5.80)
RDW: 12.2 % (ref 11.6–15.4)
WBC: 7.9 10*3/uL (ref 3.4–10.8)

## 2023-04-12 LAB — LIPID PANEL
Chol/HDL Ratio: 4.5 ratio (ref 0.0–5.0)
Cholesterol, Total: 246 mg/dL — ABNORMAL HIGH (ref 100–199)
HDL: 55 mg/dL (ref >39)
LDL Chol Calc (NIH): 178 mg/dL — ABNORMAL HIGH (ref 0–99)
Triglycerides: 77 mg/dL (ref 0–149)
VLDL Cholesterol Cal: 13 mg/dL (ref 5–40)

## 2023-04-12 LAB — CMP14+EGFR
ALT: 15 IU/L (ref 0–44)
AST: 13 IU/L (ref 0–40)
Albumin: 4.6 g/dL (ref 3.9–4.9)
Alkaline Phosphatase: 115 IU/L (ref 44–121)
BUN/Creatinine Ratio: 14 (ref 10–24)
BUN: 13 mg/dL (ref 8–27)
Bilirubin Total: 0.4 mg/dL (ref 0.0–1.2)
CO2: 22 mmol/L (ref 20–29)
Calcium: 9.7 mg/dL (ref 8.6–10.2)
Chloride: 103 mmol/L (ref 96–106)
Creatinine, Ser: 0.96 mg/dL (ref 0.76–1.27)
Globulin, Total: 3 g/dL (ref 1.5–4.5)
Glucose: 99 mg/dL (ref 70–99)
Potassium: 4.8 mmol/L (ref 3.5–5.2)
Sodium: 139 mmol/L (ref 134–144)
Total Protein: 7.6 g/dL (ref 6.0–8.5)
eGFR: 87 mL/min/{1.73_m2} (ref >59)

## 2023-04-12 LAB — VITAMIN D 25 HYDROXY (VIT D DEFICIENCY, FRACTURES): Vit D, 25-Hydroxy: 59.4 ng/mL (ref 30.0–100.0)

## 2023-04-12 LAB — TSH: TSH: 1.27 u[IU]/mL (ref 0.450–4.500)

## 2023-04-12 MED ORDER — ROSUVASTATIN CALCIUM 10 MG PO TABS: 10.0000 mg | ORAL_TABLET | Freq: Every day | ORAL | 2 refills | Status: DC

## 2023-04-12 NOTE — Addendum Note (Signed)
Addended by: Syliva Overman E on: 04/12/2023 12:51 PM   Modules accepted: Orders

## 2023-07-03 ENCOUNTER — Ambulatory Visit (INDEPENDENT_AMBULATORY_CARE_PROVIDER_SITE_OTHER): Payer: Medicare Other

## 2023-07-03 VITALS — Ht 70.0 in | Wt 170.0 lb

## 2023-07-03 DIAGNOSIS — Z0000 Encounter for general adult medical examination without abnormal findings: Secondary | ICD-10-CM

## 2023-07-03 NOTE — Progress Notes (Signed)
Because this visit was a virtual/telehealth visit,  certain criteria was not obtained, such a blood pressure, CBG if applicable, and timed get up and go. Any medications not marked as "taking" were not mentioned during the medication reconciliation part of the visit. Any vitals not documented were not able to be obtained due to this being a telehealth visit or patient was unable to self-report a recent blood pressure reading due to a lack of equipment at home via telehealth. Vitals that have been documented are verbally provided by the patient.   Subjective:   Eugene Acevedo is a 67 y.o. male who presents for Medicare Annual/Subsequent preventive examination.  Visit Complete: Virtual  I connected with  [name] on 07/03/23 by a audio enabled telemedicine application and verified that I am speaking with the correct person using two identifiers.  Patient Location: Home  Provider Location: Home Office  I discussed the limitations of evaluation and management by telemedicine. The patient expressed understanding and agreed to proceed.  Patient Medicare AWV questionnaire was completed by the patient on n/a; I have confirmed that all information answered by patient is correct and no changes since this date.  Cardiac Risk Factors include: advanced age (>8men, >92 women);dyslipidemia;hypertension;male gender     Objective:    Today's Vitals   07/03/23 1330  Weight: 170 lb (77.1 kg)  Height: 5\' 10"  (1.778 m)   Body mass index is 24.39 kg/m.     07/03/2023    1:32 PM 06/27/2022    8:08 AM 06/21/2021    8:55 AM 09/29/2017    8:13 AM 07/14/2017   12:21 PM 02/16/2017    7:35 AM  Advanced Directives  Does Patient Have a Medical Advance Directive? No No No No No No  Would patient like information on creating a medical advance directive? No - Patient declined No - Patient declined No - Patient declined No - Patient declined      Current Medications (verified) Outpatient Encounter  Medications as of 07/03/2023  Medication Sig   ALPRAZolam (XANAX) 1 MG tablet TAKE 1/2 TO 1 TABLET BY MOUTH AT BEDTIME AS NEEDED FOR ANXIETY   [START ON 07/04/2023] ALPRAZolam (XANAX) 1 MG tablet Take 1 tablet (1 mg total) by mouth at bedtime as needed for anxiety.   aspirin EC 81 MG tablet Take 1 tablet by mouth daily.   cephALEXin (KEFLEX) 500 MG capsule Take 1 capsule (500 mg total) by mouth 2 (two) times daily.   cholecalciferol (VITAMIN D) 1000 units tablet Take 1,000 Units by mouth daily.    Cyanocobalamin (VITAMIN B 12 PO) Take 1 tablet by mouth daily.    fluticasone (FLONASE) 50 MCG/ACT nasal spray Place 2 sprays into both nostrils daily.   gabapentin (NEURONTIN) 100 MG capsule TAKE 1 CAPSULE(100 MG) BY MOUTH AT BEDTIME   lisinopril (ZESTRIL) 30 MG tablet TAKE 1 TABLET(30 MG) BY MOUTH DAILY   pantoprazole (PROTONIX) 40 MG tablet TAKE 1 TABLET(40 MG) BY MOUTH DAILY   rosuvastatin (CRESTOR) 10 MG tablet Take 1 tablet (10 mg total) by mouth daily.   No facility-administered encounter medications on file as of 07/03/2023.    Allergies (verified) Patient has no known allergies.   History: Past Medical History:  Diagnosis Date   Anxiety    Aortic atherosclerosis (HCC)    CT 11/02/16   Arthritis    wrists   Complication of anesthesia    Difficult to awake   Diverticulitis of sigmoid colon    Erectile dysfunction  GAD (generalized anxiety disorder)    GERD (gastroesophageal reflux disease)    Hyperlipidemia    no meds now   Hypertension    Insomnia    Internal hemorrhoid    Nicotine addiction    Pulmonary nodules    small bilateral lungs CT 11/02/16   Ulcer    Past Surgical History:  Procedure Laterality Date   COLONOSCOPY  03/02/2017   INGUINAL HERNIA REPAIR Left 10/04/2017   Procedure: LEFT HERNIA REPAIR INGUINAL WITH MESH;  Surgeon: Andria Meuse, MD;  Location: [facility];  Service: General;  Laterality: Left;   INSERTION OF MESH Left 10/04/2017   Procedure:  INSERTION OF MESH;  Surgeon: Andria Meuse, MD;  Location: [facility];  Service: General;  Laterality: Left;   POLYPECTOMY     UPPER GASTROINTESTINAL ENDOSCOPY     Family History  Problem Relation Age of Onset   Hypertension Mother    Hypertension Father        DJD    Colon cancer Neg Hx    Esophageal cancer Neg Hx    Rectal cancer Neg Hx    Stomach cancer Neg Hx    Colon polyps Neg Hx    Crohn's disease Neg Hx    Social History   Socioeconomic History   Marital status: Married    Spouse name: Not on file   Number of children: Not on file   Years of education: Not on file   Highest education level: Not on file  Occupational History   Occupation: Curator   Tobacco Use   Smoking status: Former    Current packs/day: 0.00    Average packs/day: 0.5 packs/day for 40.0 years (20.0 ttl pk-yrs)    Types: Cigarettes    Start date: 11/07/1972    Quit date: 11/07/2012    Years since quitting: 10.6    Passive exposure: Past   Smokeless tobacco: Never  Vaping Use   Vaping status: Never Used  Substance and Sexual Activity   Alcohol use: Yes    Comment: 3 beers a day    Drug use: No   Sexual activity: Never  Other Topics Concern   Not on file  Social History Narrative   Not on file   Social Determinants of Health   Financial Resource Strain: Low Risk  (07/03/2023)   Overall Financial Resource Strain (CARDIA)    Difficulty of Paying Living Expenses: Not hard at all  Food Insecurity: No Food Insecurity (07/03/2023)   Hunger Vital Sign    Worried About Running Out of Food in the Last Year: Never true    Ran Out of Food in the Last Year: Never true  Transportation Needs: No Transportation Needs (07/03/2023)   PRAPARE - Administrator, Civil Service (Medical): No    Lack of Transportation (Non-Medical): No  Physical Activity: Sufficiently Active (07/03/2023)   Exercise Vital Sign    Days of Exercise per Week: 7 days    Minutes of Exercise per Session: 30 min   Stress: No Stress Concern Present (07/03/2023)   [employer] of Occupational Health - Occupational Stress Questionnaire    Feeling of Stress : Not at all  Social Connections: Socially Integrated (07/03/2023)   Social Connection and Isolation Panel [NHANES]    Frequency of Communication with Friends and Family: More than three times a week    Frequency of Social Gatherings with Friends and Family: More than three times a week    Attends Religious Services: More  than 4 times per year    Active Member of Clubs or Organizations: Yes    Attends Engineer, structural: More than 4 times per year    Marital Status: Married    Tobacco Counseling Counseling given: Yes   Clinical Intake:  Pre-visit preparation completed: Yes  Pain : No/denies pain     BMI - recorded: 24.39 Nutritional Status: BMI of 19-24  Normal Nutritional Risks: None Diabetes: No  How often do you need to have someone help you when you read instructions, pamphlets, or other written materials from your doctor or pharmacy?: 1 - Never  Interpreter Needed?: No  Information entered by :: Abby Keyuna Cuthrell, CMA   Activities of Daily Living    07/03/2023    1:31 PM  In your present state of health, do you have any difficulty performing the following activities:  Hearing? 0  Vision? 0  Difficulty concentrating or making decisions? 0  Walking or climbing stairs? 0  Dressing or bathing? 0  Doing errands, shopping? 0  Preparing Food and eating ? N  Using the Toilet? N  In the past six months, have you accidently leaked urine? N  Do you have problems with loss of bowel control? N  Managing your Medications? N  Managing your Finances? N  Housekeeping or managing your Housekeeping? N    Patient Care Team: Kerri Perches, MD as PCP - General  Indicate any recent Medical Services you may have received from other than [facility] providers in the past year (date may be approximate).     Assessment:   This  is a routine wellness examination for [place].  Hearing/Vision screen Hearing Screening - Comments:: Patient denies any hearing difficulties.   Vision Screening - Comments:: Patient had eye surgery at the beginning of 2024 [facility]   Goals Addressed             This Visit's Progress    Patient Stated       Not have to work so hard        Depression Screen    07/03/2023    1:34 PM 04/11/2023    8:10 AM 08/30/2022    8:07 AM 06/27/2022    8:08 AM 06/27/2022    8:07 AM 03/30/2022    8:01 AM 09/22/2021    7:57 AM  PHQ 2/9 Scores  PHQ - 2 Score 0 0 0 0 0 1 0    Fall Risk    07/03/2023    1:33 PM 04/11/2023    8:10 AM 08/30/2022    8:07 AM 06/27/2022    8:08 AM 03/30/2022    8:01 AM  Fall Risk   Falls in the past year? 0 0 0 0 0  Number falls in past yr: 0 0 0 0 0  Injury with Fall? 0 0 0 0 0  Risk for fall due to : No Fall Risks  No Fall Risks No Fall Risks   Follow up Falls prevention discussed  Falls evaluation completed Falls evaluation completed     MEDICARE RISK AT HOME: Medicare Risk at Home Any stairs in or around the home?: No If so, are there any without handrails?: No Home free of loose throw rugs in walkways, pet beds, electrical cords, etc?: Yes Adequate lighting in your home to reduce risk of falls?: Yes Life alert?: No Use of a cane, walker or w/c?: No Grab bars in the bathroom?: No Shower chair or bench in shower?: No Elevated  toilet seat or a handicapped toilet?: No  TIMED UP AND GO:  Was the test performed?  No    Cognitive Function:    06/27/2022    8:09 AM  MMSE - Mini Mental State Exam  Not completed: Unable to complete        07/03/2023    1:34 PM 06/27/2022    8:09 AM 06/21/2021    8:56 AM  6CIT Screen  What Year? 0 points 0 points 0 points  What month? 0 points 0 points 0 points  What time? 0 points 0 points 0 points  Count back from 20 0 points 0 points 0 points  Months in reverse 0 points 2 points 0 points  Repeat  phrase 0 points 0 points 0 points  Total Score 0 points 2 points 0 points    Immunizations Immunization History  Administered Date(s) Administered   Influenza Split 06/20/2014   Influenza Whole 07/12/2007   Influenza,inj,Quad PF,6+ Mos 09/23/2015, 09/07/2016   Influenza-Unspecified 05/03/2021   Pneumococcal Conjugate-13 09/03/2014   Pneumococcal Polysaccharide-23 03/23/2021   Td 11/09/2006   Tdap 04/25/2017   Zoster, Live 03/23/2016    TDAP status: Up to date  Flu Vaccine status: Due, Education has been provided regarding the importance of this vaccine. Advised may receive this vaccine at local pharmacy or Health Dept. Aware to provide a copy of the vaccination record if obtained from local pharmacy or Health Dept. Verbalized acceptance and understanding.  Pneumococcal vaccine status: Up to date  Covid-19 vaccine status: Information provided on how to obtain vaccines.   Qualifies for Shingles Vaccine? Yes   Zostavax completed No   Shingrix Completed?: No.    Education has been provided regarding the importance of this vaccine. Patient has been advised to call insurance company to determine out of pocket expense if they have not yet received this vaccine. Advised may also receive vaccine at local pharmacy or Health Dept. Verbalized acceptance and understanding.  Screening Tests Health Maintenance  Topic Date Due   INFLUENZA VACCINE  05/04/2023   COVID-19 Vaccine (1 - 2023-24 season) Never done   Medicare Annual Wellness (AWV)  06/28/2023   Zoster Vaccines- Shingrix (1 of 2) 11/23/2023 (Originally 07/06/2005)   Lung Cancer Screening  12/19/2023   Colonoscopy  03/16/2027   DTaP/Tdap/Td (3 - Td or Tdap) 04/26/2027   Pneumonia Vaccine 68+ Years old  Completed   Hepatitis C Screening  Completed   HPV VACCINES  Aged Out    Health Maintenance  Health Maintenance Due  Topic Date Due   INFLUENZA VACCINE  05/04/2023   COVID-19 Vaccine (1 - 2023-24 season) Never done   Medicare  Annual Wellness (AWV)  06/28/2023    Colorectal cancer screening: Type of screening: Colonoscopy. Completed 03/15/2022. Repeat every 5 years  Lung Cancer Screening: (Low Dose CT Chest recommended if Age 30-80 years, 20 pack-year currently smoking OR have quit w/in 15years.) does not qualify.   Additional Screening:  Hepatitis C Screening: does not qualify; Completed 10/12/2015  Vision Screening: Recommended annual ophthalmology exams for early detection of glaucoma and other disorders of the eye. Is the patient up to date with their annual eye exam?  Yes  Who is the provider or what is the name of the office in which the patient attends annual eye exams? [facility] If pt is not established with a provider, would they like to be referred to a provider to establish care? No .   Dental Screening: Recommended annual dental  exams for proper oral hygiene  Diabetic Foot Exam: n/a  Community Resource Referral / Chronic Care Management: CRR required this visit?  No   CCM required this visit?  No     Plan:     I have personally reviewed and noted the following in the patient's chart:   Medical and social history Use of alcohol, tobacco or illicit drugs  Current medications and supplements including opioid prescriptions. Patient is not currently taking opioid prescriptions. Functional ability and status Nutritional status Physical activity Advanced directives List of other physicians Hospitalizations, surgeries, and ER visits in previous 12 months Vitals Screenings to include cognitive, depression, and falls Referrals and appointments  In addition, I have reviewed and discussed with patient certain preventive protocols, quality metrics, and best practice recommendations. A written personalized care plan for preventive services as well as general preventive health recommendations were provided to patient.     Jordan Hawks Geraldy Akridge, CMA   07/03/2023   After Visit  Summary: (Mail) Due to this being a telephonic visit, the after visit summary with patients personalized plan was offered to patient via mail

## 2023-07-03 NOTE — Patient Instructions (Signed)
Eugene Acevedo , Thank you for taking time to come for your Medicare Wellness Visit. I appreciate your ongoing commitment to your health goals. Please review the following plan we discussed and let me know if I can assist you in the future.   Referrals/Orders/Follow-Ups/Clinician Recommendations:  Next Medicare Annual Wellness Visit: October 02, 2024 @ 1:50pm  This is a list of the screening recommended for you and due dates:  Health Maintenance  Topic Date Due   Flu Shot  05/04/2023   COVID-19 Vaccine (1 - 2023-24 season) Never done   Zoster (Shingles) Vaccine (1 of 2) 11/23/2023*   Screening for Lung Cancer  12/19/2023   Medicare Annual Wellness Visit  07/02/2024   Colon Cancer Screening  03/16/2027   DTaP/Tdap/Td vaccine (3 - Td or Tdap) 04/26/2027   Pneumonia Vaccine  Completed   Hepatitis C Screening  Completed   HPV Vaccine  Aged Out  *Topic was postponed. The date shown is not the original due date.    Advanced directives: (ACP Link)Information on Advanced Care Planning can be found at [facility] Advance Health Care Directives Advance Health Care Directives ([email or web address]   Next Medicare Annual Wellness Visit scheduled for next year: Yes  Preventive Care 68 Years and Older, Male Preventive care refers to lifestyle choices and visits with your health care provider that can promote health and wellness. Preventive care visits are also called wellness exams. What can I expect for my preventive care visit? Counseling During your preventive care visit, your health care provider may ask about your: Medical history, including: Past medical problems. Family medical history. History of falls. Current health, including: Emotional well-being. Home life and relationship well-being. Sexual activity. Memory and ability to understand (cognition). Lifestyle, including: Alcohol, nicotine or tobacco, and drug use. Access to firearms. Diet, exercise, and sleep  habits. Work and work Astronomer. Sunscreen use. Safety issues such as seatbelt and bike helmet use. Physical exam Your health care provider will check your: Height and weight. These may be used to calculate your BMI (body mass index). BMI is a measurement that tells if you are at a healthy weight. Waist circumference. This measures the distance around your waistline. This measurement also tells if you are at a healthy weight and may help predict your risk of certain diseases, such as type 2 diabetes and high blood pressure. Heart rate and blood pressure. Body temperature. Skin for abnormal spots. What immunizations do I need?  Vaccines are usually given at various ages, according to a schedule. Your health care provider will recommend vaccines for you based on your age, medical history, and lifestyle or other factors, such as travel or where you work. What tests do I need? Screening Your health care provider may recommend screening tests for certain conditions. This may include: Lipid and cholesterol levels. Diabetes screening. This is done by checking your blood sugar (glucose) after you have not eaten for a while (fasting). Hepatitis C test. Hepatitis B test. HIV (human immunodeficiency virus) test. STI (sexually transmitted infection) testing, if you are at risk. Lung cancer screening. Colorectal cancer screening. Prostate cancer screening. Abdominal aortic aneurysm (AAA) screening. You may need this if you are a current or former smoker. Talk with your health care provider about your test results, treatment options, and if necessary, the need for more tests. Follow these instructions at home: Eating and drinking  Eat a diet that includes fresh fruits and vegetables, whole grains, lean protein, and low-fat dairy products. Limit your  intake of foods with high amounts of sugar, saturated fats, and salt. Take vitamin and mineral supplements as recommended by your health care  provider. Do not drink alcohol if your health care provider tells you not to drink. If you drink alcohol: Limit how much you have to 0-2 drinks a day. Know how much alcohol is in your drink. In the [place] one drink equals one 12 oz bottle of beer (355 mL), one 5 oz glass of wine (148 mL), or one 1 oz glass of hard liquor (44 mL). Lifestyle Brush your teeth every morning and night with fluoride toothpaste. Floss one time each day. Exercise for at least 30 minutes 5 or more days each week. Do not use any products that contain nicotine or tobacco. These products include cigarettes, chewing tobacco, and vaping devices, such as e-cigarettes. If you need help quitting, ask your health care provider. Do not use drugs. If you are sexually active, practice safe sex. Use a condom or other form of protection to prevent STIs. Take aspirin only as told by your health care provider. Make sure that you understand how much to take and what form to take. Work with your health care provider to find out whether it is safe and beneficial for you to take aspirin daily. Ask your health care provider if you need to take a cholesterol-lowering medicine (statin). Find healthy ways to manage stress, such as: Meditation, yoga, or listening to music. Journaling. Talking to a trusted person. Spending time with friends and family. Safety Always wear your seat belt while driving or riding in a vehicle. Do not drive: If you have been drinking alcohol. Do not ride with someone who has been drinking. When you are tired or distracted. While texting. If you have been using any mind-altering substances or drugs. Wear a helmet and other protective equipment during sports activities. If you have firearms in your house, make sure you follow all gun safety procedures. Minimize exposure to UV radiation to reduce your risk of skin cancer. What's next? Visit your health care provider once a year for an annual wellness visit. Ask  your health care provider how often you should have your eyes and teeth checked. Stay up to date on all vaccines. This information is not intended to replace advice given to you by your health care provider. Make sure you discuss any questions you have with your health care provider. Document Revised: 03/17/2021 Document Reviewed: 03/17/2021 Elsevier Patient Education  2024 [employer]. Understanding Your Risk for Falls Millions of people have serious injuries from falls each year. It is important to understand your risk of falling. Talk with your health care provider about your risk and what you can do to lower it. If you do have a serious fall, make sure to tell your provider. Falling once raises your risk of falling again. How can falls affect me? Serious injuries from falls are common. These include: Broken bones, such as hip fractures. Head injuries, such as traumatic brain injuries (TBI) or concussions. A fear of falling can cause you to avoid activities and stay at home. This can make your muscles weaker and raise your risk for a fall. What can increase my risk? There are a number of risk factors that increase your risk for falling. The more risk factors you have, the higher your risk of falling. Serious injuries from a fall happen most often to people who are older than 68 years old. Teenagers and young adults ages 9-29 are also  at higher risk. Common risk factors include: Weakness in the lower body. Being generally weak or confused due to long-term (chronic) illness. Dizziness or balance problems. Poor vision. Medicines that cause dizziness or drowsiness. These may include: Medicines for your blood pressure, heart, anxiety, insomnia, or swelling (edema). Pain medicines. Muscle relaxants. Other risk factors include: Drinking alcohol. Having had a fall in the past. Having foot pain or wearing improper footwear. Working at a dangerous job. Having any of the following in your  home: Tripping hazards, such as floor clutter or loose rugs. Poor lighting. Pets. Having dementia or memory loss. What actions can I take to lower my risk of falling?     Physical activity Stay physically fit. Do strength and balance exercises. Consider taking a regular class to build strength and balance. Yoga and tai chi are good options. Vision Have your eyes checked every year and your prescription for glasses or contacts updated as needed. Shoes and walking aids Wear non-skid shoes. Wear shoes that have rubber soles and low heels. Do not wear high heels. Do not walk around the house in socks or slippers. Use a cane or walker as told by your provider. Home safety Attach secure railings on both sides of your stairs. Install grab bars for your bathtub, shower, and toilet. Use a non-skid mat in your bathtub or shower. Attach bath mats securely with double-sided, non-slip rug tape. Use good lighting in all rooms. Keep a flashlight near your bed. Make sure there is a clear path from your bed to the bathroom. Use night-lights. Do not use throw rugs. Make sure all carpeting is taped or tacked down securely. Remove all clutter from walkways and stairways, including extension cords. Repair uneven or broken steps and floors. Avoid walking on icy or slippery surfaces. Walk on the grass instead of on icy or slick sidewalks. Use ice melter to get rid of ice on walkways in the winter. Use a cordless phone. Questions to ask your health care provider Can you help me check my risk for a fall? Do any of my medicines make me more likely to fall? Should I take a vitamin D supplement? What exercises can I do to improve my strength and balance? Should I make an appointment to have my vision checked? Do I need a bone density test to check for weak bones (osteoporosis)? Would it help to use a cane or a walker? Where to find more information Centers for Disease Control and Prevention, STEADI:  TonerPromos.no Community-Based Fall Prevention Programs: TonerPromos.no [employer] on Aging: BaseRingTones.pl Contact a health care provider if: You fall at home. You are afraid of falling at home. You feel weak, drowsy, or dizzy. This information is not intended to replace advice given to you by your health care provider. Make sure you discuss any questions you have with your health care provider. Document Revised: 05/23/2022 Document Reviewed: 05/23/2022 Elsevier Patient Education  2024 [employer].

## 2023-07-19 DIAGNOSIS — Z113 Encounter for screening for infections with a predominantly sexual mode of transmission: Secondary | ICD-10-CM | POA: Diagnosis not present

## 2023-07-19 DIAGNOSIS — R3989 Other symptoms and signs involving the genitourinary system: Secondary | ICD-10-CM | POA: Diagnosis not present

## 2023-07-27 ENCOUNTER — Encounter: Payer: Self-pay | Admitting: Family Medicine

## 2023-07-27 ENCOUNTER — Ambulatory Visit (INDEPENDENT_AMBULATORY_CARE_PROVIDER_SITE_OTHER): Payer: Medicare Other | Admitting: Family Medicine

## 2023-07-27 VITALS — BP 138/82 | HR 77 | Ht 70.0 in | Wt 170.0 lb

## 2023-07-27 DIAGNOSIS — G8929 Other chronic pain: Secondary | ICD-10-CM

## 2023-07-27 DIAGNOSIS — Z2821 Immunization not carried out because of patient refusal: Secondary | ICD-10-CM

## 2023-07-27 DIAGNOSIS — I10 Essential (primary) hypertension: Secondary | ICD-10-CM

## 2023-07-27 DIAGNOSIS — M5442 Lumbago with sciatica, left side: Secondary | ICD-10-CM | POA: Diagnosis not present

## 2023-07-27 DIAGNOSIS — R5382 Chronic fatigue, unspecified: Secondary | ICD-10-CM

## 2023-07-27 DIAGNOSIS — E785 Hyperlipidemia, unspecified: Secondary | ICD-10-CM | POA: Diagnosis not present

## 2023-07-27 DIAGNOSIS — R5383 Other fatigue: Secondary | ICD-10-CM | POA: Insufficient documentation

## 2023-07-27 DIAGNOSIS — E559 Vitamin D deficiency, unspecified: Secondary | ICD-10-CM

## 2023-07-27 DIAGNOSIS — F411 Generalized anxiety disorder: Secondary | ICD-10-CM

## 2023-07-27 MED ORDER — METHYLPREDNISOLONE ACETATE 80 MG/ML IJ SUSP
40.0000 mg | Freq: Once | INTRAMUSCULAR | Status: AC
  Administered 2023-07-27: 40 mg via INTRAMUSCULAR

## 2023-07-27 MED ORDER — PREDNISONE 10 MG PO TABS: 10.0000 mg | ORAL_TABLET | Freq: Two times a day (BID) | ORAL | 0 refills | Status: DC

## 2023-07-27 MED ORDER — PAROXETINE HCL 10 MG PO TABS: 10.0000 mg | ORAL_TABLET | Freq: Every day | ORAL | 2 refills | Status: DC

## 2023-07-27 MED ORDER — ALPRAZOLAM 1 MG PO TABS: ORAL_TABLET | ORAL | 1 refills | Status: DC

## 2023-07-27 NOTE — Assessment & Plan Note (Signed)
Current flare z 1 week, depo medrol IM followed by 5 day course of prednisone

## 2023-07-27 NOTE — Assessment & Plan Note (Signed)
Uncontrolled , paxil prescribed advised of need to take consistently, therapy recommended, no interest

## 2023-07-27 NOTE — Patient Instructions (Addendum)
F/U in 10 weeks , re evaluate blood pressure and anxiety, call if you need me sooner  Labs today B12 level, testosterone level, TSH, lipid, cmp and eGFR  NEED to take medications every day at the same time as they are prescrinbed  Paxil 10 mg daily is prescribed for anxiety  5 day course of prednisone is prescribed for back pain radiaiting down left leg also Depo medrol 40 mg iM in office today for back pain with sciatica  You NEED to slowq dowm, smell the roses, and dedicate time to leisure  All cancer screening tests are up to date

## 2023-07-27 NOTE — Progress Notes (Signed)
Eugene Acevedo     MRN: [id number]      DOB: 09-Nov-1955  Chief Complaint  Patient presents with   Follow-up    Feeling tired    HPI Eugene Acevedo is here for follow up and re-evaluation of chronic medical conditions, medication management and review of any available recent lab and radiology data.  Preventive health is updated, specifically  Cancer screening and Immunization.   Questions or concerns regarding consultations or procedures which the PT has had in the interim are  addressed. The PT denies any adverse reactions to current medications since the last visit.  Main concern s fatigue ROS Denies recent fever or chills. Denies sinus pressure, nasal congestion, ear pain or sore throat. Denies chest congestion, productive cough or wheezing. Denies chest pains, palpitations and leg swelling Denies abdominal pain, nausea, vomiting,diarrhea or constipation.   Denies dysuria, frequency, hesitancy or incontinence. Denies joint pain, swelling and limitation in mobility. Denies headaches, seizures, numbness, or tingling. C/OBJECTIVE: excessive anxiety , concerned he may have cancer, works all the time and no time carved out for pleasure.   PE  BP 138/82   Pulse 77   Ht 5\' 10"  (1.778 m)   Wt 170 lb 0.6 oz (77.1 kg)   SpO2 98%   BMI 24.40 kg/m   Patient alert and oriented and in no cardiopulmonary distress.  HEENT: No facial asymmetry, EOMI,     Neck supple .  Chest: Clear to auscultation bilaterally.  CVS: S1, S2 no murmurs, no S3.Regular rate.  ABD: Soft non tender.   Ext: No edema  GD:JMEQASTMH ROM lumbar spine, adequate in shoulders, hips and knees.  Skin: Intact, no ulcerations or rash noted.  Psych: Good eye contact, normal affect. Memory intact  anxious not depressed appearing.  CNS: CN 2-12 intact, power,  normal throughout.no focal deficits noted.   Assessment & Plan  Essential hypertension Uncontrolled, reports that at times he misses medication and  self medicates by taking "An extra piece " of hois tablet when he thinksBPis high Advised that he needs to consistently take medication as prescribed , will re address at next visit DASH diet and commitment to daily physical activity for a minimum of 30 minutes discussed and encouraged, as a part of hypertension management. The importance of attaining a healthy weight is also discussed.     07/27/2023   11:25 AM 07/27/2023   10:37 AM 07/03/2023    1:30 PM 04/11/2023    8:09 AM 08/30/2022    8:36 AM 08/30/2022    8:10 AM 08/30/2022    8:06 AM  BP/Weight  Systolic BP 138 132 -- 122 120 132 150  Diastolic BP 82 76 -- 76 72 78 78  Wt. (Lbs)  170.04 170 167   175  BMI  24.4 kg/m2 24.39 kg/m2 23.96 kg/m2   25.11 kg/m2       Hyperlipidemia LDL goal <100 Hyperlipidemia:Low fat diet discussed and encouraged.   Lipid Panel  Lab Results  Component Value Date   CHOL 246 (H) 04/11/2023   HDL 55 04/11/2023   LDLCALC 178 (H) 04/11/2023   TRIG 77 04/11/2023   CHOLHDL 4.5 04/11/2023     Uncontrolled  Updated lab needed at/ before next visit.   Refused influenza vaccine Offered, educated and again refused flu vaccine  GAD (generalized anxiety disorder) Uncontrolled , paxil prescribed advised of need to take consistently, therapy recommended, no interest  Fatigue Reporrts adequat sleep , on avg 7 hrs, but reports  exercise tolerance and energy ;level are down, unsure how much is due to age , wants to be sure all camcer screening is UTD,denies exertional chest pai. Does report mental fatigue, always worrying, not resting as he should Check testosterone level, B12 , TSH  Vitamin D deficiency Updated lab needed at/ before next visit.   Chronic midline low back pain with left-sided sciatica  Current flare z 1 week, depo medrol IM followed by 5 day course of prednisone

## 2023-07-27 NOTE — Assessment & Plan Note (Signed)
Uncontrolled, reports that at times he misses medication and self medicates by taking "An extra piece " of hois tablet when he thinksBPis high Advised that he needs to consistently take medication as prescribed , will re address at next visit DASH diet and commitment to daily physical activity for a minimum of 30 minutes discussed and encouraged, as a part of hypertension management. The importance of attaining a healthy weight is also discussed.     07/27/2023   11:25 AM 07/27/2023   10:37 AM 07/03/2023    1:30 PM 04/11/2023    8:09 AM 08/30/2022    8:36 AM 08/30/2022    8:10 AM 08/30/2022    8:06 AM  BP/Weight  Systolic BP 138 132 -- 122 120 132 150  Diastolic BP 82 76 -- 76 72 78 78  Wt. (Lbs)  170.04 170 167   175  BMI  24.4 kg/m2 24.39 kg/m2 23.96 kg/m2   25.11 kg/m2

## 2023-07-27 NOTE — Assessment & Plan Note (Signed)
Hyperlipidemia:Low fat diet discussed and encouraged.   Lipid Panel  Lab Results  Component Value Date   CHOL 246 (H) 04/11/2023   HDL 55 04/11/2023   LDLCALC 178 (H) 04/11/2023   TRIG 77 04/11/2023   CHOLHDL 4.5 04/11/2023     Uncontrolled  Updated lab needed at/ before next visit.

## 2023-07-27 NOTE — Assessment & Plan Note (Signed)
Updated lab needed at/ before next visit.   

## 2023-07-27 NOTE — Assessment & Plan Note (Signed)
Offered, educated and again refused flu vaccine

## 2023-07-27 NOTE — Assessment & Plan Note (Signed)
Reporrts adequat sleep , on avg 7 hrs, but reports exercise tolerance and energy ;level are down, unsure how much is due to age , wants to be sure all camcer screening is UTD,denies exertional chest pai. Does report mental fatigue, always worrying, not resting as he should Check testosterone level, B12 , TSH

## 2023-08-02 LAB — LIPID PANEL
Chol/HDL Ratio: 3.1 ratio (ref 0.0–5.0)
Cholesterol, Total: 190 mg/dL (ref 100–199)
HDL: 62 mg/dL (ref >39)
LDL Chol Calc (NIH): 113 mg/dL — ABNORMAL HIGH (ref 0–99)
Triglycerides: 82 mg/dL (ref 0–149)
VLDL Cholesterol Cal: 15 mg/dL (ref 5–40)

## 2023-08-02 LAB — CMP14+EGFR
ALT: 12 IU/L (ref 0–44)
AST: 16 [IU]/L (ref 0–40)
Albumin: 4.8 g/dL (ref 3.9–4.9)
Alkaline Phosphatase: 108 [IU]/L (ref 44–121)
BUN/Creatinine Ratio: 15 (ref 10–24)
BUN: 13 mg/dL (ref 8–27)
Bilirubin Total: 0.4 mg/dL (ref 0.0–1.2)
CO2: 20 mmol/L (ref 20–29)
Calcium: 9.1 mg/dL (ref 8.6–10.2)
Chloride: 101 mmol/L (ref 96–106)
Creatinine, Ser: 0.86 mg/dL (ref 0.76–1.27)
Globulin, Total: 2.4 g/dL (ref 1.5–4.5)
Glucose: 97 mg/dL (ref 70–99)
Potassium: 4.2 mmol/L (ref 3.5–5.2)
Sodium: 140 mmol/L (ref 134–144)
Total Protein: 7.2 g/dL (ref 6.0–8.5)
eGFR: 94 mL/min/{1.73_m2} (ref >59)

## 2023-08-02 LAB — TESTOSTERONE,FREE AND TOTAL
Testosterone, Free: 4.9 pg/mL — ABNORMAL LOW (ref 6.6–18.1)
Testosterone: 337 ng/dL (ref 264–916)

## 2023-08-02 LAB — VITAMIN B12: Vitamin B-12: 573 pg/mL (ref 232–1245)

## 2023-08-02 LAB — TSH: TSH: 1.21 u[IU]/mL (ref 0.450–4.500)

## 2023-09-20 ENCOUNTER — Other Ambulatory Visit: Payer: Self-pay | Admitting: Family Medicine

## 2023-10-03 ENCOUNTER — Other Ambulatory Visit: Payer: Self-pay | Admitting: Family Medicine

## 2023-10-03 DIAGNOSIS — M5441 Lumbago with sciatica, right side: Secondary | ICD-10-CM

## 2023-10-12 ENCOUNTER — Ambulatory Visit: Payer: Medicare Other | Admitting: Family Medicine

## 2023-10-25 NOTE — Progress Notes (Signed)
 Attempted to reach patient regarding follow-up LDCT. Unable to reach patient directly. Detailed VM left asking that the patient return my call.

## 2023-10-26 ENCOUNTER — Other Ambulatory Visit: Payer: Self-pay

## 2023-10-26 DIAGNOSIS — Z87891 Personal history of nicotine dependence: Secondary | ICD-10-CM

## 2023-10-26 DIAGNOSIS — Z122 Encounter for screening for malignant neoplasm of respiratory organs: Secondary | ICD-10-CM

## 2023-11-02 ENCOUNTER — Ambulatory Visit (INDEPENDENT_AMBULATORY_CARE_PROVIDER_SITE_OTHER): Payer: HMO | Admitting: Family Medicine

## 2023-11-02 ENCOUNTER — Encounter: Payer: Self-pay | Admitting: Family Medicine

## 2023-11-02 VITALS — BP 120/70 | HR 67 | Ht 70.0 in | Wt 168.0 lb

## 2023-11-02 DIAGNOSIS — I10 Essential (primary) hypertension: Secondary | ICD-10-CM | POA: Diagnosis not present

## 2023-11-02 DIAGNOSIS — G8929 Other chronic pain: Secondary | ICD-10-CM

## 2023-11-02 DIAGNOSIS — E785 Hyperlipidemia, unspecified: Secondary | ICD-10-CM | POA: Diagnosis not present

## 2023-11-02 DIAGNOSIS — R5382 Chronic fatigue, unspecified: Secondary | ICD-10-CM

## 2023-11-02 DIAGNOSIS — Z87891 Personal history of nicotine dependence: Secondary | ICD-10-CM

## 2023-11-02 DIAGNOSIS — M5442 Lumbago with sciatica, left side: Secondary | ICD-10-CM | POA: Diagnosis not present

## 2023-11-02 MED ORDER — GABAPENTIN 100 MG PO CAPS: 100.0000 mg | ORAL_CAPSULE | Freq: Every day | ORAL | 3 refills | Status: AC

## 2023-11-02 MED ORDER — MONTELUKAST SODIUM 10 MG PO TABS: 10.0000 mg | ORAL_TABLET | Freq: Every day | ORAL | 5 refills | Status: AC

## 2023-11-02 MED ORDER — ALPRAZOLAM 1 MG PO TABS: 1.0000 mg | ORAL_TABLET | Freq: Every evening | ORAL | 0 refills | Status: AC | PRN

## 2023-11-02 NOTE — Assessment & Plan Note (Signed)
Remains nicotine free Has screening scan scheduled

## 2023-11-02 NOTE — Assessment & Plan Note (Addendum)
Improved, just continues to report working all the time , but he loves to do that and is content, he is self employed, negative depression screen

## 2023-11-02 NOTE — Progress Notes (Signed)
Eugene Acevedo     MRN: [id number]      DOB: 12-30-1955  Chief Complaint  Patient presents with   Follow-up    Follow up    HPI Eugene Acevedo is here for follow up and re-evaluation of chronic medical conditions, medication management and review of any available recent lab and radiology data.  Preventive health is updated, specifically  Cancer screening and Immunization.   Questions or concerns regarding consultations or procedures which the PT has had in the interim are  addressed. The PT denies any adverse reactions to current medications since the last visit.  Increased and uncontrolled allergy symptoms x 4 weeks, no fever or chills or body aches. Uses paxil maybe once per week when he feels agitated , not taking it daily as he does not he feel jhe needs irt that often Back pain improved, no interest insurgery, gabapentin effective ROS Denies recent fever or chills. Denies sinus pressure, nasal congestion, ear pain or sore throat. Denies chest congestion, productive cough or wheezing. Denies chest pains, palpitations and leg swelling Denies abdominal pain, nausea, vomiting,diarrhea or constipation.   Denies dysuria, frequency, hesitancy or incontinence. Denies uncontrolled joint pain, swelling and limitation in mobility. Denies headaches, seizures, numbness, or tingling. Denies depression, uncontrolled anxiety or insomnia. Denies skin break down or rash.   PE  BP 120/70   Pulse 67   Ht 5\' 10"  (1.778 m)   Wt 168 lb 0.6 oz (76.2 kg)   SpO2 97%   BMI 24.11 kg/m   Patient alert and oriented and in no cardiopulmonary distress.  HEENT: No facial asymmetry, EOMI,     Neck supple .  Chest: Clear to auscultation bilaterally.  CVS: S1, S2 no murmurs, no S3.Regular rate.     Ext: No edema  MS: Adequate ROM spine, shoulders, hips and knees.  Skin: Intact, no ulcerations or rash noted.  Psych: Good eye contact, normal affect. Memory intact mildly anxious not  depressed  appearing.  CNS: CN 2-12 intact, power,  normal throughout.no focal deficits noted.   Assessment & Plan  Essential hypertension Controlled, no change in medication DASH diet and commitment to daily physical activity for a minimum of 30 minutes discussed and encouraged, as a part of hypertension management. The importance of attaining a healthy weight is also discussed.     11/02/2023   10:41 AM 11/02/2023   10:08 AM 07/27/2023   11:25 AM 07/27/2023   10:37 AM 07/03/2023    1:30 PM 04/11/2023    8:09 AM 08/30/2022    8:36 AM  BP/Weight  Systolic BP 120 127 138 132 -- 122 120  Diastolic BP 70 77 82 76 -- 76 72  Wt. (Lbs)  168.04  170.04 170 167   BMI  24.11 kg/m2  24.4 kg/m2 24.39 kg/m2 23.96 kg/m2        Chronic midline low back pain with left-sided sciatica Improved, no current flare, pain controlled with low dose gabapentin  Hyperlipidemia LDL goal <100 Hyperlipidemia:Low fat diet discussed and encouraged.   Lipid Panel  Lab Results  Component Value Date   CHOL 190 07/27/2023   HDL 62 07/27/2023   LDLCALC 113 (H) 07/27/2023   TRIG 82 07/27/2023   CHOLHDL 3.1 07/27/2023     Improved on medication , not at goal Updated lab needed .   History of smoking 30 or more pack years Remains nicotine free Has screening scan scheduled  Fatigue Improved, just continues to report working all the  time , but he loves to do that and is content, he is self employed, negative depression screen

## 2023-11-02 NOTE — Assessment & Plan Note (Signed)
Hyperlipidemia:Low fat diet discussed and encouraged.   Lipid Panel  Lab Results  Component Value Date   CHOL 190 07/27/2023   HDL 62 07/27/2023   LDLCALC 113 (H) 07/27/2023   TRIG 82 07/27/2023   CHOLHDL 3.1 07/27/2023     Improved on medication , not at goal Updated lab needed .

## 2023-11-02 NOTE — Patient Instructions (Signed)
Annual exam.  In July early August, call if you need me sooner.  Lipid panel CMP and EGFR today.  New medication for uncontrolled allergies is montelukast 1 daily at bedtime.  Continue to remain nicotine free.  Please continue to follow diet high in vegetables and fruit and low in fatty foods salty foods and sweets.   Best for 2025!  Thanks for choosing [facility], we consider it a privelige to serve you.

## 2023-11-02 NOTE — Assessment & Plan Note (Signed)
Controlled, no change in medication DASH diet and commitment to daily physical activity for a minimum of 30 minutes discussed and encouraged, as a part of hypertension management. The importance of attaining a healthy weight is also discussed.     11/02/2023   10:41 AM 11/02/2023   10:08 AM 07/27/2023   11:25 AM 07/27/2023   10:37 AM 07/03/2023    1:30 PM 04/11/2023    8:09 AM 08/30/2022    8:36 AM  BP/Weight  Systolic BP 120 127 138 132 -- 122 120  Diastolic BP 70 77 82 76 -- 76 72  Wt. (Lbs)  168.04  170.04 170 167   BMI  24.11 kg/m2  24.4 kg/m2 24.39 kg/m2 23.96 kg/m2

## 2023-11-02 NOTE — Assessment & Plan Note (Signed)
Improved, no current flare, pain controlled with low dose gabapentin

## 2023-11-03 LAB — CMP14+EGFR
ALT: 14 [IU]/L (ref 0–44)
AST: 16 [IU]/L (ref 0–40)
Albumin: 4.5 g/dL (ref 3.9–4.9)
Alkaline Phosphatase: 107 [IU]/L (ref 44–121)
BUN/Creatinine Ratio: 15 (ref 10–24)
BUN: 14 mg/dL (ref 8–27)
Bilirubin Total: 0.5 mg/dL (ref 0.0–1.2)
CO2: 23 mmol/L (ref 20–29)
Calcium: 9.2 mg/dL (ref 8.6–10.2)
Chloride: 104 mmol/L (ref 96–106)
Creatinine, Ser: 0.94 mg/dL (ref 0.76–1.27)
Globulin, Total: 2.3 g/dL (ref 1.5–4.5)
Glucose: 99 mg/dL (ref 70–99)
Potassium: 4.5 mmol/L (ref 3.5–5.2)
Sodium: 142 mmol/L (ref 134–144)
Total Protein: 6.8 g/dL (ref 6.0–8.5)
eGFR: 88 mL/min/{1.73_m2} (ref >59)

## 2023-11-03 LAB — LIPID PANEL
Chol/HDL Ratio: 3.1 {ratio} (ref 0.0–5.0)
Cholesterol, Total: 160 mg/dL (ref 100–199)
HDL: 51 mg/dL (ref >39)
LDL Chol Calc (NIH): 94 mg/dL (ref 0–99)
Triglycerides: 76 mg/dL (ref 0–149)
VLDL Cholesterol Cal: 15 mg/dL (ref 5–40)

## 2023-12-19 ENCOUNTER — Ambulatory Visit (HOSPITAL_COMMUNITY)
Admission: RE | Admit: 2023-12-19 | Discharge: 2023-12-19 | Disposition: A | Discharge status: Home / Self Care | Payer: HMO | Source: Ambulatory Visit | Attending: Oncology | Admitting: Oncology

## 2023-12-19 DIAGNOSIS — Z122 Encounter for screening for malignant neoplasm of respiratory organs: Secondary | ICD-10-CM | POA: Diagnosis not present

## 2023-12-19 DIAGNOSIS — Z87891 Personal history of nicotine dependence: Secondary | ICD-10-CM | POA: Insufficient documentation

## 2024-01-03 ENCOUNTER — Other Ambulatory Visit: Payer: Self-pay | Admitting: Family Medicine

## 2024-01-12 ENCOUNTER — Other Ambulatory Visit: Payer: Self-pay | Admitting: Orthopedic Surgery

## 2024-01-12 DIAGNOSIS — M5416 Radiculopathy, lumbar region: Secondary | ICD-10-CM | POA: Diagnosis not present

## 2024-01-15 ENCOUNTER — Ambulatory Visit: Payer: Self-pay | Admitting: Internal Medicine

## 2024-01-16 NOTE — Progress Notes (Signed)
Patient notified of LDCT Lung Cancer Screening Results via mail with the recommendation to follow-up in 12 months. Patient's referring provider has been sent a copy of results. Results are as follows:  IMPRESSION: 1. Lung-RADS 2, benign appearance or behavior. Continue annual screening with low-dose chest CT without contrast in 12 months. 2. Aortic atherosclerosis (ICD10-I70.0). Coronary artery calcification. 3.  Emphysema (ICD10-J43.9).  

## 2024-01-22 ENCOUNTER — Ambulatory Visit
Admission: RE | Admit: 2024-01-22 | Discharge: 2024-01-22 | Disposition: A | Discharge status: Home / Self Care | Source: Ambulatory Visit | Attending: Orthopedic Surgery | Admitting: Orthopedic Surgery

## 2024-01-22 DIAGNOSIS — M5126 Other intervertebral disc displacement, lumbar region: Secondary | ICD-10-CM | POA: Diagnosis not present

## 2024-01-22 DIAGNOSIS — M48061 Spinal stenosis, lumbar region without neurogenic claudication: Secondary | ICD-10-CM | POA: Diagnosis not present

## 2024-01-22 DIAGNOSIS — M5416 Radiculopathy, lumbar region: Secondary | ICD-10-CM

## 2024-01-22 DIAGNOSIS — M47816 Spondylosis without myelopathy or radiculopathy, lumbar region: Secondary | ICD-10-CM | POA: Diagnosis not present

## 2024-01-29 DIAGNOSIS — M5416 Radiculopathy, lumbar region: Secondary | ICD-10-CM | POA: Diagnosis not present

## 2024-01-30 DIAGNOSIS — M5416 Radiculopathy, lumbar region: Secondary | ICD-10-CM | POA: Diagnosis not present

## 2024-02-06 DIAGNOSIS — M5416 Radiculopathy, lumbar region: Secondary | ICD-10-CM | POA: Diagnosis not present

## 2024-02-16 DIAGNOSIS — H16223 Keratoconjunctivitis sicca, not specified as Sjogren's, bilateral: Secondary | ICD-10-CM | POA: Diagnosis not present

## 2024-02-19 DIAGNOSIS — Z125 Encounter for screening for malignant neoplasm of prostate: Secondary | ICD-10-CM | POA: Diagnosis not present

## 2024-02-23 DIAGNOSIS — M5416 Radiculopathy, lumbar region: Secondary | ICD-10-CM | POA: Diagnosis not present

## 2024-02-26 ENCOUNTER — Other Ambulatory Visit: Payer: Self-pay | Admitting: Family Medicine

## 2024-02-27 ENCOUNTER — Other Ambulatory Visit: Payer: Self-pay | Admitting: Family Medicine

## 2024-03-05 ENCOUNTER — Other Ambulatory Visit: Payer: Self-pay | Admitting: Orthopedic Surgery

## 2024-03-07 NOTE — Progress Notes (Signed)
 Surgical Instructions   Your procedure is scheduled on Thursday, June 12th. Report to [facility] Main Entrance "A" at 5:30 A.M., then check in with the Admitting office. Any questions or running late day of surgery: call [phone]  Questions prior to your surgery date: call [phone], Monday-Friday, 8am-4pm. If you experience any cold or flu symptoms such as cough, fever, chills, shortness of breath, etc. between now and your scheduled surgery, please notify us  at the above number.     Remember:  Do not eat after midnight the night before your surgery  You may drink clear liquids until 4:30 the morning of your surgery.   Clear liquids allowed are: Water, Non-Citrus Juices (without pulp), Carbonated Beverages, Clear Tea (no milk, honey, etc.), Black Coffee Only (NO MILK, CREAM OR POWDERED CREAMER of any kind), and Gatorade.    Take these medicines the morning of surgery with A SIP OF WATER  pantoprazole  (PROTONIX )  rosuvastatin  (CRESTOR )   May take these medicines IF NEEDED: diphenhydrAMINE (BENADRYL)    One week prior to surgery, STOP taking any Aspirin (unless otherwise instructed by your surgeon) Aleve, Naproxen, Ibuprofen, Motrin, Advil, Goody's, BC's, all herbal medications, fish oil, and non-prescription vitamins.  Patient Instructions  The night before surgery:  No food after midnight. ONLY clear liquids after midnight  The day of surgery (if you do NOT have diabetes):  Drink ONE (1) Pre-Surgery Clear Ensure by 430 the morning of surgery. Drink in one sitting. Do not sip.  This drink was given to you during your hospital pre-op appointment visit.  Nothing else to drink after completing the Pre-Surgery Clear Ensure.         If you have questions, please contact your surgeon's office.                      Do NOT Smoke (Tobacco/Vaping) for 24 hours prior to your procedure.  If you use a CPAP at night, you may bring your mask/headgear for your overnight stay.    You will be asked to remove any contacts, glasses, piercing's, hearing aid's, dentures/partials prior to surgery. Please bring cases for these items if needed.    Patients discharged the day of surgery will not be allowed to drive home, and someone needs to stay with them for 24 hours.  SURGICAL WAITING ROOM VISITATION Patients may have no more than 2 support people in the waiting area - these visitors may rotate.   Pre-op nurse will coordinate an appropriate time for 1 ADULT support person, who may not rotate, to accompany patient in pre-op.  Children under the age of 17 must have an adult with them who is not the patient and must remain in the main waiting area with an adult.  If the patient needs to stay at the hospital during part of their recovery, the visitor guidelines for inpatient rooms apply.  Please refer to the [facility] website for the visitor guidelines for any additional information.   If you received a COVID test during your pre-op visit  it is requested that you wear a mask when out in public, stay away from anyone that may not be feeling well and notify your surgeon if you develop symptoms. If you have been in contact with anyone that has tested positive in the last 10 days please notify you surgeon.      Pre-operative 5 CHG Bathing Instructions   You can play a key role in reducing the risk of infection after  surgery. Your skin needs to be as free of germs as possible. You can reduce the number of germs on your skin by washing with CHG (chlorhexidine  gluconate) soap before surgery. CHG is an antiseptic soap that kills germs and continues to kill germs even after washing.   DO NOT use if you have an allergy to chlorhexidine /CHG or antibacterial soaps. If your skin becomes reddened or irritated, stop using the CHG and notify one of our RNs at [phone].   Please shower with the CHG soap starting 4 days before surgery using the following schedule:     Please keep  in mind the following:  DO NOT shave, including legs and underarms, starting the day of your first shower.   You may shave your face at any point before/day of surgery.  Place clean sheets on your bed the day you start using CHG soap. Use a clean washcloth (not used since being washed) for each shower. DO NOT sleep with pets once you start using the CHG.   CHG Shower Instructions:  Wash your face and private area with normal soap. If you choose to wash your hair, wash first with your normal shampoo.  After you use shampoo/soap, rinse your hair and body thoroughly to remove shampoo/soap residue.  Turn the water OFF and apply about 3 tablespoons (45 ml) of CHG soap to a CLEAN washcloth.  Apply CHG soap ONLY FROM YOUR NECK DOWN TO YOUR TOES (washing for 3-5 minutes)  DO NOT use CHG soap on face, private areas, open wounds, or sores.  Pay special attention to the area where your surgery is being performed.  If you are having back surgery, having someone wash your back for you may be helpful. Wait 2 minutes after CHG soap is applied, then you may rinse off the CHG soap.  Pat dry with a clean towel  Put on clean clothes/pajamas   If you choose to wear lotion, please use ONLY the CHG-compatible lotions that are listed below.  Additional instructions for the day of surgery: DO NOT APPLY any lotions, deodorants, cologne, or perfumes.   Do not bring valuables to the hospital. [facility] is not responsible for any belongings/valuables. Do not wear nail polish, gel polish, artificial nails, or any other type of covering on natural nails (fingers and toes) Do not wear jewelry or makeup Put on clean/comfortable clothes.  Please brush your teeth.  Ask your nurse before applying any prescription medications to the skin.     CHG Compatible Lotions   Aveeno Moisturizing lotion  Cetaphil Moisturizing Cream  Cetaphil Moisturizing Lotion  Clairol Herbal Essence Moisturizing Lotion, Dry Skin   Clairol Herbal Essence Moisturizing Lotion, Extra Dry Skin  Clairol Herbal Essence Moisturizing Lotion, Normal Skin  Curel Age Defying Therapeutic Moisturizing Lotion with Alpha Hydroxy  Curel Extreme Care Body Lotion  Curel Soothing Hands Moisturizing Hand Lotion  Curel Therapeutic Moisturizing Cream, Fragrance-Free  Curel Therapeutic Moisturizing Lotion, Fragrance-Free  Curel Therapeutic Moisturizing Lotion, Original Formula  Eucerin Daily Replenishing Lotion  Eucerin Dry Skin Therapy Plus Alpha Hydroxy Crme  Eucerin Dry Skin Therapy Plus Alpha Hydroxy Lotion  Eucerin Original Crme  Eucerin Original Lotion  Eucerin Plus Crme Eucerin Plus Lotion  Eucerin TriLipid Replenishing Lotion  Keri Anti-Bacterial Hand Lotion  Keri Deep Conditioning Original Lotion Dry Skin Formula Softly Scented  Keri Deep Conditioning Original Lotion, Fragrance Free Sensitive Skin Formula  Keri Lotion Fast Absorbing Fragrance Free Sensitive Skin Formula  Keri Lotion Fast Absorbing Softly Scented Dry  Skin Formula  Keri Original Lotion  Keri Skin Renewal Lotion Keri Silky Smooth Lotion  Keri Silky Smooth Sensitive Skin Lotion  Nivea Body Creamy Conditioning Oil  Nivea Body Extra Enriched Lotion  Nivea Body Original Lotion  Nivea Body Sheer Moisturizing Lotion Nivea Crme  Nivea Skin Firming Lotion  NutraDerm 30 Skin Lotion  NutraDerm Skin Lotion  NutraDerm Therapeutic Skin Cream  NutraDerm Therapeutic Skin Lotion  ProShield Protective Hand Cream  Provon moisturizing lotion  Please read over the following fact sheets that you were given.

## 2024-03-08 ENCOUNTER — Encounter (HOSPITAL_COMMUNITY): Payer: Self-pay

## 2024-03-08 ENCOUNTER — Other Ambulatory Visit: Payer: Self-pay

## 2024-03-08 ENCOUNTER — Encounter (HOSPITAL_COMMUNITY)
Admission: RE | Admit: 2024-03-08 | Discharge: 2024-03-08 | Disposition: A | Discharge status: Home / Self Care | Source: Ambulatory Visit | Attending: Orthopedic Surgery | Admitting: Orthopedic Surgery

## 2024-03-08 VITALS — BP 159/88 | HR 71 | Temp 97.7°F | Resp 18 | Ht 70.0 in | Wt 163.4 lb

## 2024-03-08 DIAGNOSIS — I700 Atherosclerosis of aorta: Secondary | ICD-10-CM | POA: Insufficient documentation

## 2024-03-08 DIAGNOSIS — Z01812 Encounter for preprocedural laboratory examination: Secondary | ICD-10-CM | POA: Diagnosis present

## 2024-03-08 DIAGNOSIS — I10 Essential (primary) hypertension: Secondary | ICD-10-CM | POA: Diagnosis not present

## 2024-03-08 DIAGNOSIS — Z01818 Encounter for other preprocedural examination: Secondary | ICD-10-CM | POA: Insufficient documentation

## 2024-03-08 DIAGNOSIS — E785 Hyperlipidemia, unspecified: Secondary | ICD-10-CM | POA: Insufficient documentation

## 2024-03-08 DIAGNOSIS — K219 Gastro-esophageal reflux disease without esophagitis: Secondary | ICD-10-CM | POA: Diagnosis not present

## 2024-03-08 DIAGNOSIS — F419 Anxiety disorder, unspecified: Secondary | ICD-10-CM | POA: Insufficient documentation

## 2024-03-08 DIAGNOSIS — Z01810 Encounter for preprocedural cardiovascular examination: Secondary | ICD-10-CM | POA: Diagnosis present

## 2024-03-08 DIAGNOSIS — M5416 Radiculopathy, lumbar region: Secondary | ICD-10-CM | POA: Diagnosis not present

## 2024-03-08 DIAGNOSIS — Z87891 Personal history of nicotine dependence: Secondary | ICD-10-CM | POA: Insufficient documentation

## 2024-03-08 DIAGNOSIS — R918 Other nonspecific abnormal finding of lung field: Secondary | ICD-10-CM | POA: Insufficient documentation

## 2024-03-08 DIAGNOSIS — R9431 Abnormal electrocardiogram [ECG] [EKG]: Secondary | ICD-10-CM | POA: Diagnosis not present

## 2024-03-08 LAB — BASIC METABOLIC PANEL WITH GFR
Anion gap: 9 (ref 5–15)
BUN: 10 mg/dL (ref 8–23)
CO2: 27 mmol/L (ref 22–32)
Calcium: 9.2 mg/dL (ref 8.9–10.3)
Chloride: 104 mmol/L (ref 98–111)
Creatinine, Ser: 0.82 mg/dL (ref 0.61–1.24)
GFR, Estimated: >60 mL/min (ref >60)
Glucose, Bld: 114 mg/dL — ABNORMAL HIGH (ref 70–99)
Potassium: 4.3 mmol/L (ref 3.5–5.1)
Sodium: 140 mmol/L (ref 135–145)

## 2024-03-08 LAB — CBC
HCT: 44.6 % (ref 39.0–52.0)
Hemoglobin: 14.9 g/dL (ref 13.0–17.0)
MCH: 31.3 pg (ref 26.0–34.0)
MCHC: 33.4 g/dL (ref 30.0–36.0)
MCV: 93.7 fL (ref 80.0–100.0)
Platelets: 216 10*3/uL (ref 150–400)
RBC: 4.76 MIL/uL (ref 4.22–5.81)
RDW: 12.7 % (ref 11.5–15.5)
WBC: 6.9 10*3/uL (ref 4.0–10.5)
nRBC: 0 % (ref 0.0–0.2)

## 2024-03-08 LAB — SURGICAL PCR SCREEN
MRSA, PCR: NEGATIVE
Staphylococcus aureus: POSITIVE — AB

## 2024-03-08 NOTE — Progress Notes (Signed)
 PCP - Dr. Alberteen Huge Cardiologist - Denies - seen Dr. Manley Seeds in 2021 for low HR, had an echo and cath done. HTN managed by PCP.  PPM/ICD - Denies Device Orders - N/A Rep Notified - N/A  Chest x-ray - N/A EKG - 03-08-24 Stress Test - Denies ECHO - 11-28-19 - CE Cardiac Cath - 01-14-20 - CE  Sleep Study - Denies CPAP - N/A  Non-diabetic  Last dose of GLP1 agonist-  Denies GLP1 instructions: N/A  Blood Thinner Instructions:Denies Aspirin Instructions:Denies  ERAS Protcol - Clears till 0430 PRE-SURGERY Ensure or G2- Ensure   COVID TEST- N/A   Anesthesia review: Yes, HTN, arthrosclerosis, new EKG  Patient denies shortness of breath, fever, cough and chest pain at PAT appointment. Patient denies any respiratory issues at this time.    All instructions explained to the patient, with a verbal understanding of the material. Patient agrees to go over the instructions while at home for a better understanding. Patient also instructed to self quarantine after being tested for COVID-19. The opportunity to ask questions was provided.

## 2024-03-11 NOTE — Progress Notes (Signed)
 Anesthesia Chart Review:  Case: [id number] Date/Time: 03/13/24 1155   Procedure: DECOMPRESSIVE LUMBAR LAMINECTOMY LEVEL 2 - LUMBAR 4 - LUMBAR 5, LUMBAR 5- SACRUM 1 DECOMPRESSION   Anesthesia type: General   Pre-op diagnosis: LUMBAR RADICULOPATHY   [facility]: [facility]OM 05 / [facility]   Surgeons: Eugene Grimes, Eugene Acevedo       DISCUSSION: Patient is a 68 year old male scheduled for the above procedure.  History includes former smoker (quit 11/07/12), HTN, HLD, GERD, anxiety, aortic atherosclerosis, pulmonary nodules (stable < 5.5 mm 12/19/23 CT). Reported prolonged emergence with anesthesia.   He had a cardiology evaluation by Lula Sale, Eugene Acevedo in 12/2019 after he had a passed out following an episode where it was difficult for him to get some food down. He had a stress echo in 11/2019  without chest pain or EKG changes, but findings showed apical and anteroseptal ischemia. No significant DOE. He was referred for LHC with "RTC prn abnormal findings." LHC was done on 01/14/20 and showed normal LM, 20% proximal LAD, 100% distal LAD with left to left collaterals, small D1 65%, normal ramus int, 30% mid LCX, 30% OM1, 20% proximal and mid RCA, minimal luminal irregularities PDA. Medical therapy recommended. He is on Crestor  and has been managed by primary care since.   PCP is Dr. Rodolph Clap with [facility], last visit 11/02/23 for re-evaluation of chronic medical conditions. HTN controlled. Lipid panel updated. LDL 94 (down from 113 07/27/23).   He had a borderline aneurysmal abdominal aorta at 3.1 cm by US  in 08/2020.   Discussed with anesthesiologist Arvie Latus, Eugene Acevedo. Will reach out to Dr. Rodolph Clap for input and touch base with Dr. Wallace Gully scheduler. Chart will be left for follow-up.   ADDENDUM 03/12/24 5:16 PM: I spoke with Dr. Jackee Marus today. He classified case as "moderately urgent." I sent a staff message to PCP Dr. Rodolph Clap late yesterday afternoon, and I sent her an update with Dr. Wallace Gully  input this afternoon. Messages are marked as reviewed. Currently, no additional input has been received. I will send update to Dr. Brain Cahill.    VS: BP (!) 159/88   Pulse 71   Temp 36.5 C   Resp 18   Ht 5\' 10"  (1.778 m)   Wt 74.1 kg   SpO2 98%   BMI 23.45 kg/m    PROVIDERS: Towanda Fret, Eugene Acevedo is PCP  Parry Bolster, Eugene Acevedo is urologist   LABS: Labs reviewed: Acceptable for surgery. Normal LFTs 11/02/23.  (all labs ordered are listed, but only abnormal results are displayed)  Labs Reviewed  SURGICAL PCR SCREEN - Abnormal; Notable for the following components:      Result Value   Staphylococcus aureus POSITIVE (*)    All other components within normal limits  BASIC METABOLIC PANEL WITH GFR - Abnormal; Notable for the following components:   Glucose, Bld 114 (*)    All other components within normal limits  CBC     IMAGES: MRI L-spine 01/22/24: IMPRESSION: - Degenerative changes of the lumbar spine as above. Disc bulge and facet arthrosis at L4-5 resulting in lateral recess narrowing and moderate spinal canal stenosis with impingement upon the traversing nerve roots. - Disc bulge at L5-S1 results in lateral recess narrowing with possible impingement upon the traversing left S1 nerve root. - Multilevel foraminal stenosis, greatest and severe on the right at L4-5 which is slightly increased from prior. - Facet arthrosis throughout the lumbar spine most pronounced at L4-5. - Degenerative endplate changes  and discogenic edema posteriorly at L1-2 which may contribute to back pain.   CT Chest LCS 12/19/23: IMPRESSION: 1. Lung-RADS 2, benign appearance or behavior. Continue annual screening with low-dose chest CT without contrast in 12 months. 2. Aortic atherosclerosis (ICD10-I70.0). Coronary artery calcification. 3.  Emphysema (ICD10-J43.9).     EKG: 03/08/24: Sinus bradycardia at 58 bpm Possible Left atrial enlargement Inferior infarct , age undetermined Abnormal ECG When  compared with ECG of 21-Jun-2021 09:17, No significant change since last tracing Confirmed by Camnitz, Will ([id number]) on 03/08/2024 3:03:30 PM   CV: US  Aorta Screening 08/20/20: IMPRESSION: Borderline aneurysmal dilation of the proximal abdominal aorta measuring up to 3.1 cm. Recommend follow-up every 3 years. This recommendation follows ACR consensus guidelines: White Paper of the ACR Incidental Findings Committee II on Vascular Findings. J Am Coll Radiol 2013; 10:789-794.   Cardiac cath 01/14/20 ([facility]): Coronary Findings Diagnostic Dominance: Right  Left Main: The vessel is angiographically normal.  Left Anterior Descending: Prox LAD lesion is 20% stenosed. Dist LAD lesion is 100% stenosed. Lt to Lt collaterals. First Diagonal Branch: 1st Diag lesion is 65% stenosed. Small vessel.  Ramus Intermedius: The vessel is angiographically normal.  Left Circumflex: Mid Cx lesion is 30% stenosed. First Obtuse Marginal Branch: 1st Mrg lesion is 30% stenosed.  Right Coronary Artery: Prox RCA lesion is 20% stenosed. Mid RCA lesion is 20% stenosed. Right Posterior Descending Artery: The vessel exhibits minimal luminal irregularities.   Coronary angiogram via right radial for positive stress test.  LM engaged, normal.  LAD mild disease prox, D1 has borderline obstructive disease but would require stenting into LAD, distal LAD occluded after D2 with L-L collaterals and appears to be a small/diminutive vessel.  Very small ramus, free of significant disease.  LCx has mild disease, OM1 mild disease proximally.  RCA engaged, is dominant with mild disease.  AV not crossed.  EBL < 5 cc, prelude sync for hemostasis, no specimens.  Discussed with referring cardiologist, recommend medical therapy at this time.   Maximize medical therapy.    Stress Echo 11/28/19 ([facility]): The estimated LV ejection fraction is 50-55% with stress. With stress, the LVEF does not augment appropriately and the subtle apical  hypokinesis worsens and extends to the mid to apical anteroseptal wall. Positive exercise echocardiography for inducible ischemia at target heart rate.    Past Medical History:  Diagnosis Date   Anxiety    Aortic atherosclerosis (HCC)    CT 11/02/16   Arthritis    wrists   Complication of anesthesia    Difficult to awake   Diverticulitis of sigmoid colon    Erectile dysfunction    GAD (generalized anxiety disorder)    GERD (gastroesophageal reflux disease)    Hyperlipidemia    no meds now   Hypertension    Insomnia    Internal hemorrhoid    Nicotine addiction    Pulmonary nodules    small bilateral lungs CT 11/02/16   Ulcer     Past Surgical History:  Procedure Laterality Date   COLONOSCOPY  03/02/2017   INGUINAL HERNIA REPAIR Left 10/04/2017   Procedure: LEFT HERNIA REPAIR INGUINAL WITH MESH;  Surgeon: Melvenia Stabs, Eugene Acevedo;  Location: [facility];  Service: General;  Laterality: Left;   INSERTION OF MESH Left 10/04/2017   Procedure: INSERTION OF MESH;  Surgeon: Melvenia Stabs, Eugene Acevedo;  Location: [facility];  Service: General;  Laterality: Left;   POLYPECTOMY     UPPER GASTROINTESTINAL ENDOSCOPY  MEDICATIONS:  ALPRAZolam  (XANAX ) 1 MG tablet   cholecalciferol (VITAMIN D ) 1000 units tablet   Cyanocobalamin  (VITAMIN B 12 PO)   diphenhydrAMINE (BENADRYL) 25 MG tablet   gabapentin  (NEURONTIN ) 100 MG capsule   gabapentin  (NEURONTIN ) 100 MG capsule   lisinopril  (ZESTRIL ) 20 MG tablet   lisinopril  (ZESTRIL ) 30 MG tablet   montelukast  (SINGULAIR ) 10 MG tablet   pantoprazole  (PROTONIX ) 40 MG tablet   PARoxetine  (PAXIL ) 10 MG tablet   rosuvastatin  (CRESTOR ) 10 MG tablet   No current facility-administered medications for this encounter.    Ella Gun, PA-C Surgical Short Stay/Anesthesiology [facility] Phone [phone] [facility] Phone [phone] 03/11/2024 6:06 PM

## 2024-03-12 ENCOUNTER — Telehealth: Payer: Self-pay

## 2024-03-12 NOTE — Progress Notes (Signed)
 SDW call, patient updated with new surgery date of 6/111/2025, arrival time of 0940 and ERAS Clears until 0910.

## 2024-03-12 NOTE — Anesthesia Preprocedure Evaluation (Addendum)
 Anesthesia Evaluation    Reviewed: Allergy & Precautions, Patient's Chart, lab work & pertinent test results  History of Anesthesia Complications (+) PROLONGED EMERGENCE and history of anesthetic complications  Airway        Dental   Pulmonary former smoker          Cardiovascular hypertension, Pt. on medications      Neuro/Psych  PSYCHIATRIC DISORDERS Anxiety      Neuromuscular disease (L4-5,L5-S1 radiculopathy)    GI/Hepatic Neg liver ROS,GERD  Medicated and Controlled,,  Endo/Other  negative endocrine ROS    Renal/GU Lab Results      Component                Value               Date                     K                        4.3                 03/08/2024                   CREATININE               0.82                03/08/2024                    Musculoskeletal  (+) Arthritis ,    Abdominal   Peds  Hematology Lab Results      Component                Value               Date                      WBC                      6.9                 03/08/2024                HGB                      14.9                03/08/2024                HCT                      44.6                03/08/2024                MCV                      93.7                03/08/2024                PLT                      216                 03/08/2024  Anesthesia Other Findings NKDA  Reproductive/Obstetrics                             Anesthesia Physical Anesthesia Plan  ASA: 2  Anesthesia Plan: General   Post-op Pain Management: Ketamine IV* and Ofirmev  IV (intra-op)*   Induction: Intravenous  PONV Risk Score and Plan: 3 and Dexamethasone , Ondansetron , Treatment may vary due to age or medical condition and Midazolam   Airway Management Planned: Oral ETT  Additional Equipment: None  Intra-op Plan:   Post-operative Plan: Extubation in OR  Informed Consent:      Dental advisory  given  Plan Discussed with: CRNA and Surgeon  Anesthesia Plan Comments: (See PAT note written 03/11/2024 by Ella Gun, PA-C.  )       Anesthesia Quick Evaluation

## 2024-03-12 NOTE — Telephone Encounter (Signed)
 Copied from CRM [id number]. Topic: Clinical - Medical Advice >> Mar 12, 2024 10:32 AM Antwanette L wrote: Reason for CRM: Margretta Shi from [facility] Anesthesia Dept is calling to get Dr. Rodolph Clap input on the patient. The patient is scheduled for surgery on 6/11. Please have  Dr. Rodolph Clap to read from [facility] that was sent on 6/9 at 5:34pm

## 2024-03-13 ENCOUNTER — Encounter (HOSPITAL_COMMUNITY): Payer: Self-pay | Admitting: Orthopedic Surgery

## 2024-03-13 ENCOUNTER — Ambulatory Visit (HOSPITAL_COMMUNITY)

## 2024-03-13 ENCOUNTER — Ambulatory Visit (HOSPITAL_COMMUNITY)
Admission: RE | Admit: 2024-03-13 | Discharge: 2024-03-13 | Disposition: A | Discharge status: Home / Self Care | Attending: Orthopedic Surgery | Admitting: Orthopedic Surgery

## 2024-03-13 ENCOUNTER — Other Ambulatory Visit: Payer: Self-pay

## 2024-03-13 ENCOUNTER — Encounter (HOSPITAL_COMMUNITY)
Admission: RE | Disposition: A | Discharge status: Home / Self Care | Payer: Self-pay | Source: Home / Self Care | Attending: Orthopedic Surgery

## 2024-03-13 ENCOUNTER — Encounter (HOSPITAL_COMMUNITY): Payer: Self-pay | Admitting: Vascular Surgery

## 2024-03-13 ENCOUNTER — Telehealth: Payer: Self-pay

## 2024-03-13 ENCOUNTER — Encounter (HOSPITAL_COMMUNITY): Payer: Self-pay | Admitting: Anesthesiology

## 2024-03-13 DIAGNOSIS — M5416 Radiculopathy, lumbar region: Secondary | ICD-10-CM | POA: Insufficient documentation

## 2024-03-13 DIAGNOSIS — Z539 Procedure and treatment not carried out, unspecified reason: Secondary | ICD-10-CM | POA: Insufficient documentation

## 2024-03-13 SURGERY — DECOMPRESSIVE LUMBAR LAMINECTOMY LEVEL 2
Anesthesia: General

## 2024-03-13 MED ORDER — LACTATED RINGERS IV SOLN: INTRAVENOUS | Status: DC

## 2024-03-13 MED ORDER — ORAL CARE MOUTH RINSE: 15.0000 mL | Freq: Once | OROMUCOSAL | Status: AC

## 2024-03-13 MED ORDER — CEFAZOLIN SODIUM-DEXTROSE 2-4 GM/100ML-% IV SOLN: 2.0000 g | INTRAVENOUS | Status: DC

## 2024-03-13 MED ORDER — CHLORHEXIDINE GLUCONATE 0.12 % MT SOLN: 15.0000 mL | Freq: Once | OROMUCOSAL | Status: AC

## 2024-03-13 MED ORDER — CEFAZOLIN SODIUM-DEXTROSE 2-4 GM/100ML-% IV SOLN
INTRAVENOUS | Status: AC
  Filled 2024-03-13: qty 100

## 2024-03-13 MED ORDER — POVIDONE-IODINE 7.5 % EX SOLN: Freq: Once | CUTANEOUS | Status: DC

## 2024-03-13 MED ORDER — CHLORHEXIDINE GLUCONATE 0.12 % MT SOLN
OROMUCOSAL | Status: AC
  Administered 2024-03-13: 15 mL via OROMUCOSAL
  Filled 2024-03-13: qty 15

## 2024-03-13 NOTE — Progress Notes (Signed)
 Patient called with updated time of 0910 for arrival

## 2024-03-13 NOTE — Progress Notes (Addendum)
 Per dr. Lasalle Pointer the procedure needs to be cancelled due to no cardiac clearance. Pt is made aware that he has see his cardiologist and call dr. Wallace Gully office to re-schedule his surgery. Pt is being discharged home.

## 2024-03-13 NOTE — Telephone Encounter (Signed)
 Copied from CRM [id number]. Topic: Clinical - Medical Advice >> Mar 13, 2024 11:14 AM Hassie Lint wrote: Reason for CRM: Patient states he needs the results of his stress test to have an upcoming surgery. Previous call he was advised to see his cardiologist but patient states he doesn't see a cardiologist. Patient is requesting to speak with a nurse in the office directly for further assistance.  Patient can be reached at [phone]

## 2024-03-14 NOTE — Telephone Encounter (Signed)
 Needs cardiology cklearance and they are aware

## 2024-03-18 NOTE — Telephone Encounter (Signed)
 INFORMED

## 2024-04-09 ENCOUNTER — Telehealth: Payer: Self-pay

## 2024-04-09 DIAGNOSIS — Z01810 Encounter for preprocedural cardiovascular examination: Secondary | ICD-10-CM

## 2024-04-09 NOTE — Telephone Encounter (Signed)
 Copied from CRM [id number]. Topic: Clinical - Request for Lab/Test Order >> Apr 09, 2024 12:03 PM Powell HERO wrote: Reason for CRM: Patient states he needs to get a stress test to be cleared for surgery. Had to postpone his surgery to complete a stress test first. Requesting Dr Antonetta order him a stress test. Please call to advise.

## 2024-04-09 NOTE — Addendum Note (Signed)
 Addended by: ANTONETTA ROLLENE BRAVO on: 04/09/2024 01:25 PM   Modules accepted: Orders

## 2024-04-11 NOTE — Telephone Encounter (Signed)
Pt informed

## 2024-04-19 ENCOUNTER — Telehealth: Payer: Self-pay

## 2024-04-19 NOTE — Telephone Encounter (Signed)
 Pt was informed multiple times that Dr. Antonetta has placed a referral to Cardiology as we cannot order stress test. They will be contacting him with an appointment

## 2024-04-19 NOTE — Telephone Encounter (Signed)
 Copied from CRM [id number]. Topic: Clinical - Request for Lab/Test Order >> Apr 18, 2024  4:13 PM Jasmin G wrote: Reason for CRM: Pt. Was told that he would need a stress test done by a nurse before upcoming surgery. Please call pt. Back ASAP.

## 2024-04-25 DIAGNOSIS — M5416 Radiculopathy, lumbar region: Secondary | ICD-10-CM | POA: Diagnosis not present

## 2024-04-25 NOTE — Telephone Encounter (Signed)
 It has been over  weeks since we have placed cardiology referral. Pt is needing to get in with a cardiologist urgently as surgery is on hold until cleared by cardiology. Can we check on this referral?

## 2024-04-26 NOTE — Telephone Encounter (Signed)
 Lvm to cb. Just want to give pt the cardiology office information so he can call them to make an appt, might be faster than waiting on them to contact him. Referral sent to: [facility] Cardiology - [facility] [address] [place] [address] [phone]

## 2024-04-26 NOTE — Telephone Encounter (Signed)
 Please be mindful that if a Patient needs to be seen Urgently the Referral needs to be placed as Urgent, this Referral was placed as routine.  Also, Please keep in mind if a Patient is already established Patient does not need a Referral he can simply call and scheduled his own Appt :)

## 2024-04-30 DIAGNOSIS — M5430 Sciatica, unspecified side: Secondary | ICD-10-CM | POA: Diagnosis not present

## 2024-04-30 DIAGNOSIS — I10 Essential (primary) hypertension: Secondary | ICD-10-CM | POA: Diagnosis not present

## 2024-04-30 DIAGNOSIS — I2510 Atherosclerotic heart disease of native coronary artery without angina pectoris: Secondary | ICD-10-CM | POA: Diagnosis not present

## 2024-04-30 DIAGNOSIS — Z01810 Encounter for preprocedural cardiovascular examination: Secondary | ICD-10-CM | POA: Diagnosis not present

## 2024-04-30 DIAGNOSIS — E785 Hyperlipidemia, unspecified: Secondary | ICD-10-CM | POA: Diagnosis not present

## 2024-05-02 DIAGNOSIS — I10 Essential (primary) hypertension: Secondary | ICD-10-CM | POA: Diagnosis not present

## 2024-05-02 DIAGNOSIS — Z01810 Encounter for preprocedural cardiovascular examination: Secondary | ICD-10-CM | POA: Diagnosis not present

## 2024-05-02 DIAGNOSIS — Z79899 Other long term (current) drug therapy: Secondary | ICD-10-CM | POA: Diagnosis not present

## 2024-05-02 DIAGNOSIS — E785 Hyperlipidemia, unspecified: Secondary | ICD-10-CM | POA: Diagnosis not present

## 2024-05-02 DIAGNOSIS — R9439 Abnormal result of other cardiovascular function study: Secondary | ICD-10-CM | POA: Diagnosis not present

## 2024-05-02 DIAGNOSIS — I2510 Atherosclerotic heart disease of native coronary artery without angina pectoris: Secondary | ICD-10-CM | POA: Diagnosis not present

## 2024-05-03 DIAGNOSIS — I10 Essential (primary) hypertension: Secondary | ICD-10-CM | POA: Diagnosis not present

## 2024-06-11 ENCOUNTER — Other Ambulatory Visit: Payer: Self-pay | Admitting: Family Medicine

## 2024-06-19 NOTE — Progress Notes (Signed)
   06/19/2024  Patient ID: Eugene Acevedo, male   DOB: Feb 15, 1956, 68 y.o.   MRN: [id number]  Pharmacy Quality Measure Review  This patient is appearing on a report for being at risk of failing the adherence measure for cholesterol (statin) medications this calendar year.   Medication: rosuvastatin  Last fill date: 04/05/24 for 90 day supply  Insurance report was not up to date. No action needed at this time.   Lang Sieve, PharmD, BCGP Clinical Pharmacist  [phone]

## 2024-07-03 ENCOUNTER — Ambulatory Visit: Payer: Medicare Other

## 2024-07-03 VITALS — Ht 70.0 in | Wt 165.0 lb

## 2024-07-03 DIAGNOSIS — Z0000 Encounter for general adult medical examination without abnormal findings: Secondary | ICD-10-CM | POA: Diagnosis not present

## 2024-07-03 NOTE — Patient Instructions (Signed)
 Eugene Acevedo,  Thank you for taking the time for your Medicare Wellness Visit. I appreciate your continued commitment to your health goals. Please review the care plan we discussed, and feel free to reach out if I can assist you further.  Medicare recommends these wellness visits once per year to help you and your care team stay ahead of potential health issues. These visits are designed to focus on prevention, allowing your provider to concentrate on managing your acute and chronic conditions during your regular appointments.  Please note that Annual Wellness Visits do not include a physical exam. Some assessments may be limited, especially if the visit was conducted virtually. If needed, we may recommend a separate in-person follow-up with your provider.  Wishing you excellent health and many blessings in the year to come!  -Harshith Pursell, CMA  Ongoing Care Seeing your primary care provider every 3 to 6 months helps us  monitor your health and provide consistent, personalized care.   Recommended Screenings:  Health Maintenance  Topic Date Due   Zoster (Shingles) Vaccine (1 of 2) 07/06/2005   Flu Shot  05/03/2024   COVID-19 Vaccine (1 - 2024-25 season) Never done   Screening for Lung Cancer  12/18/2024   Medicare Annual Wellness Visit  07/03/2025   Colon Cancer Screening  03/16/2027   DTaP/Tdap/Td vaccine (3 - Td or Tdap) 04/26/2027   Pneumococcal Vaccine for age over 47  Completed   Hepatitis C Screening  Completed   HPV Vaccine  Aged Out   Meningitis B Vaccine  Aged Out       07/03/2024    5:48 PM  Advanced Directives  Does Patient Have a Medical Advance Directive? No  Would patient like information on creating a medical advance directive? No - Patient declined   Advance Care Planning is important because it: Ensures you receive medical care that aligns with your values, goals, and preferences. Provides guidance to your family and loved ones, reducing the emotional burden of  decision-making during critical moments.  Vision: Annual vision screenings are recommended for early detection of glaucoma, cataracts, and diabetic retinopathy. These exams can also reveal signs of chronic conditions such as diabetes and high blood pressure.  Dental: Annual dental screenings help detect early signs of oral cancer, gum disease, and other conditions linked to overall health, including heart disease and diabetes.  Please see the attached documents for additional preventive care recommendations.

## 2024-07-03 NOTE — Progress Notes (Signed)
 Please attest and cosign this visit due to patients primary care provider not being immediately available at the time the visit was completed.   Subjective:   Eugene Acevedo is a 67 y.o. who presents for a Medicare Wellness preventive visit. As a reminder, Annual Wellness Visits don't include a physical exam, and some assessments may be limited, especially if this visit is performed virtually. We may recommend an in-person follow-up visit with your provider if needed.  Visit Complete: Virtual I connected with  Eugene Acevedo on 07/03/24 by a audio enabled telemedicine application and verified that I am speaking with the correct person using two identifiers.  Patient Location: Home  Provider Location: Home Office  I discussed the limitations of evaluation and management by telemedicine. The patient expressed understanding and agreed to proceed.  Vital Signs: Because this visit was a virtual/telehealth visit, some criteria may be missing or patient reported. Any vitals not documented were not able to be obtained and vitals that have been documented are patient reported. VideoDeclined- This patient declined Librarian, academic. Therefore the visit was completed with audio only.  Persons Participating in Visit: Patient.  AWV Questionnaire: No: Patient Medicare AWV questionnaire was not completed prior to this visit. Cardiac Risk Factors include: advanced age (>13men, >29 women);dyslipidemia;hypertension;male gender     Objective:    Today's Vitals   07/03/24 1745  Weight: 165 lb (74.8 kg)  Height: 5' 10 (1.778 m)   Body mass index is 23.68 kg/m.    07/03/2024    5:48 PM 03/13/2024    9:32 AM 03/08/2024    9:10 AM 07/03/2023    1:32 PM 06/27/2022    8:08 AM 06/21/2021    8:55 AM 09/29/2017    8:13 AM  Advanced Directives  Does Patient Have a Medical Advance Directive? No Unable to assess, patient is non-responsive or altered mental status No No No No  No   Would patient like information on creating a medical advance directive? No - Patient declined No - Patient declined No - Patient declined No - Patient declined No - Patient declined No - Patient declined No - Patient declined      Data saved with a previous flowsheet row definition    Current Medications (verified) Outpatient Encounter Medications as of 07/03/2024  Medication Sig   cholecalciferol (VITAMIN D ) 1000 units tablet Take 1,000 Units by mouth daily.    Cyanocobalamin  (VITAMIN B 12 PO) Take 1 tablet by mouth daily.    diphenhydrAMINE (BENADRYL) 25 MG tablet Take 12.5 mg by mouth daily as needed for allergies.   gabapentin  (NEURONTIN ) 100 MG capsule Take 1 capsule (100 mg total) by mouth at bedtime. (Patient taking differently: Take 100 mg by mouth at bedtime as needed (pain).)   lisinopril  (ZESTRIL ) 20 MG tablet Take 10 mg by mouth daily.   lisinopril  (ZESTRIL ) 30 MG tablet TAKE 1 TABLET(30 MG) BY MOUTH DAILY   pantoprazole  (PROTONIX ) 40 MG tablet TAKE 1 TABLET(40 MG) BY MOUTH DAILY   ALPRAZolam  (XANAX ) 1 MG tablet Take 1 tablet (1 mg total) by mouth at bedtime as needed for anxiety. (Patient not taking: Reported on 07/03/2024)   gabapentin  (NEURONTIN ) 100 MG capsule TAKE 1 CAPSULE(100 MG) BY MOUTH AT BEDTIME (Patient not taking: Reported on 07/03/2024)   montelukast  (SINGULAIR ) 10 MG tablet Take 1 tablet (10 mg total) by mouth at bedtime. (Patient not taking: Reported on 07/03/2024)   PARoxetine  (PAXIL ) 10 MG tablet TAKE 1 TABLET(10 MG) BY  MOUTH DAILY (Patient not taking: Reported on 07/03/2024)   rosuvastatin  (CRESTOR ) 10 MG tablet TAKE 1 TABLET(10 MG) BY MOUTH DAILY (Patient not taking: Reported on 07/03/2024)   No facility-administered encounter medications on file as of 07/03/2024.    Allergies (verified) Patient has no known allergies.   History: Past Medical History:  Diagnosis Date   Anxiety    Aortic atherosclerosis    CT 11/02/16   Arthritis    wrists    Complication of anesthesia    Difficult to awake   Diverticulitis of sigmoid colon    Erectile dysfunction    GAD (generalized anxiety disorder)    GERD (gastroesophageal reflux disease)    Hyperlipidemia    no meds now   Hypertension    Insomnia    Internal hemorrhoid    Nicotine addiction    Pulmonary nodules    small bilateral lungs CT 11/02/16   Ulcer    Past Surgical History:  Procedure Laterality Date   COLONOSCOPY  03/02/2017   INGUINAL HERNIA REPAIR Left 10/04/2017   Procedure: LEFT HERNIA REPAIR INGUINAL WITH MESH;  Surgeon: Teresa Lonni HERO, MD;  Location: [facility];  Service: General;  Laterality: Left;   INSERTION OF MESH Left 10/04/2017   Procedure: INSERTION OF MESH;  Surgeon: Teresa Lonni HERO, MD;  Location: [facility];  Service: General;  Laterality: Left;   POLYPECTOMY     UPPER GASTROINTESTINAL ENDOSCOPY     Family History  Problem Relation Age of Onset   Hypertension Mother    Hypertension Father        DJD    Colon cancer Neg Hx    Esophageal cancer Neg Hx    Rectal cancer Neg Hx    Stomach cancer Neg Hx    Colon polyps Neg Hx    Crohn's disease Neg Hx    Social History   Socioeconomic History   Marital status: Married    Spouse name: Not on file   Number of children: Not on file   Years of education: Not on file   Highest education level: Not on file  Occupational History   Occupation: Curator   Tobacco Use   Smoking status: Former    Current packs/day: 0.00    Average packs/day: 0.5 packs/day for 40.0 years (20.0 ttl pk-yrs)    Types: Cigarettes    Start date: 11/07/1972    Quit date: 11/07/2012    Years since quitting: 11.6    Passive exposure: Past   Smokeless tobacco: Never  Vaping Use   Vaping status: Never Used  Substance and Sexual Activity   Alcohol use: Yes    Comment: 3 beers a day    Drug use: No   Sexual activity: Never  Other Topics Concern   Not on file  Social History Narrative   Not on file   Social Drivers of Health    Financial Resource Strain: Low Risk  (07/03/2024)   Overall Financial Resource Strain (CARDIA)    Difficulty of Paying Living Expenses: Not hard at all  Food Insecurity: No Food Insecurity (07/03/2024)   Hunger Vital Sign    Worried About Radiation protection practitioner of Food in the Last Year: Never true    Ran Out of Food in the Last Year: Never true  Transportation Needs: No Transportation Needs (07/03/2024)   PRAPARE - Administrator, Civil Service (Medical): No    Lack of Transportation (Non-Medical): No  Physical Activity: Sufficiently Active (07/03/2024)   Exercise Vital  Sign    Days of Exercise per Week: 7 days    Minutes of Exercise per Session: 30 min  Stress: No Stress Concern Present (07/03/2024)   [employer] of Occupational Health - Occupational Stress Questionnaire    Feeling of Stress: Not at all  Social Connections: Socially Integrated (07/03/2024)   Social Connection and Isolation Panel    Frequency of Communication with Friends and Family: More than three times a week    Frequency of Social Gatherings with Friends and Family: More than three times a week    Attends Religious Services: More than 4 times per year    Active Member of [employer] or Organizations: Yes    Attends Engineer, structural: More than 4 times per year    Marital Status: Married    Tobacco Counseling Counseling given: Yes   Clinical Intake: Pre-visit preparation completed: Yes Pain : No/denies pain   BMI - recorded: 23.68 Nutritional Risks: None Diabetes: No Lab Results  Component Value Date   HGBA1C 5.5 03/30/2022   HGBA1C 5.3 10/12/2015   HGBA1C 5.3 03/11/2014    How often do you need to have someone help you when you read instructions, pamphlets, or other written materials from your doctor or pharmacy?: 1 - Never Interpreter Needed?: No Information entered by :: Dre Gamino W CMA (AAMA)  Activities of Daily Living     07/03/2024    5:48 PM 03/08/2024    9:14 AM  In your present  state of health, do you have any difficulty performing the following activities:  Hearing? 0   Vision? 0   Difficulty concentrating or making decisions? 0   Walking or climbing stairs? 0   Dressing or bathing? 0   Doing errands, shopping? 0 0  Preparing Food and eating ? N   Using the Toilet? N   In the past six months, have you accidently leaked urine? N   Do you have problems with loss of bowel control? N   Managing your Medications? N   Managing your Finances? N   Housekeeping or managing your Housekeeping? N    Patient Care Team: Antonetta Rollene BRAVO, MD as PCP - General Elspeth Lauraine DEL, OD as Referring Physician (Ophthalmology) Beuford Anes, MD as Consulting Physician (Orthopedic Surgery) Drucie Punches, MD as Referring Physician (Urology) Rayne Doffing, OD (Optometry) Cesario Boer, MD as Attending Physician (Physical Medicine and Rehabilitation)   I have updated your Care Teams any recent Medical Services you may have received from other providers in the past year.     Assessment:   This is a routine wellness examination for Eugene Acevedo.  Hearing/Vision screen Hearing Screening - Comments:: Patient denies any hearing difficulties.   Vision Screening - Comments:: Patient wears reading glasses only. Up to date with yearly exams.  [facility].   Goals Addressed               This Visit's Progress     Stay active and healthy (pt-stated)          Depression Screen     07/03/2024    5:52 PM 11/02/2023   10:09 AM 07/27/2023   10:38 AM 07/03/2023    1:34 PM 04/11/2023    8:10 AM 08/30/2022    8:07 AM 06/27/2022    8:08 AM  PHQ 2/9 Scores  PHQ - 2 Score 0 0 2 0 0 0 0  PHQ- 9 Score 0 0 4         Fall Risk  07/03/2024    5:51 PM 11/02/2023   10:09 AM 07/27/2023   10:38 AM 07/03/2023    1:33 PM 04/11/2023    8:10 AM  Fall Risk   Falls in the past year? 0 0 0 0 0  Number falls in past yr: 0 0 0 0 0  Injury with Fall? 0 0 0 0 0  Risk for fall due  to : No Fall Risks No Fall Risks No Fall Risks No Fall Risks   Follow up Falls evaluation completed;Education provided;Falls prevention discussed Falls evaluation completed Falls evaluation completed Falls prevention discussed     MEDICARE RISK AT HOME:  Medicare Risk at Home Any stairs in or around the home?: Yes If so, are there any without handrails?: No Home free of loose throw rugs in walkways, pet beds, electrical cords, etc?: Yes Adequate lighting in your home to reduce risk of falls?: Yes Life alert?: No Use of a cane, walker or w/c?: No Grab bars in the bathroom?: No Shower chair or bench in shower?: No Elevated toilet seat or a handicapped toilet?: No  TIMED UP AND GO: Was the test performed?  No  Cognitive Function: 6CIT completed    06/27/2022    8:09 AM  MMSE - Mini Mental State Exam  Not completed: Unable to complete        07/03/2024    5:52 PM 07/03/2023    1:34 PM 06/27/2022    8:09 AM 06/21/2021    8:56 AM  6CIT Screen  What Year? 0 points 0 points 0 points 0 points  What month? 0 points 0 points 0 points 0 points  What time? 0 points 0 points 0 points 0 points  Count back from 20 0 points 0 points 0 points 0 points  Months in reverse 0 points 0 points 2 points 0 points  Repeat phrase 0 points 0 points 0 points 0 points  Total Score 0 points 0 points 2 points 0 points    Immunizations Immunization History  Administered Date(s) Administered   Fluzone Influenza virus vaccine,trivalent (IIV3), split virus 07/12/2007   Influenza Split 06/20/2014   Influenza Whole 07/12/2007   Influenza,inj,Quad PF,6+ Mos 09/23/2015, 09/07/2016   Influenza-Unspecified 06/20/2014, 09/23/2015, 09/07/2016, 05/03/2021   Pneumococcal Conjugate-13 09/03/2014   Pneumococcal Polysaccharide-23 03/23/2021   Td 11/09/2006   Tdap 04/25/2017   Zoster, Live 03/23/2016    Screening Tests Health Maintenance  Topic Date Due   Zoster Vaccines- Shingrix (1 of 2) 07/06/2005    Influenza Vaccine  05/03/2024   COVID-19 Vaccine (1 - 2024-25 season) Never done   Lung Cancer Screening  12/18/2024   Medicare Annual Wellness (AWV)  07/03/2025   Colonoscopy  03/16/2027   DTaP/Tdap/Td (3 - Td or Tdap) 04/26/2027   Pneumococcal Vaccine: 50+ Years  Completed   Hepatitis C Screening  Completed   HPV VACCINES  Aged Out   Meningococcal B Vaccine  Aged Out    Health Maintenance Health Maintenance Due  Topic Date Due   Zoster Vaccines- Shingrix (1 of 2) 07/06/2005   Influenza Vaccine  05/03/2024   COVID-19 Vaccine (1 - 2024-25 season) Never done   Health Maintenance Items Addressed: Patient advised of recommended vaccines and where to obtain those vaccines with verbal understanding  Additional Screening: Vision Screening: Recommended annual ophthalmology exams for early detection of glaucoma and other disorders of the eye. Would you like a referral to an eye doctor? No    Dental Screening: Recommended annual dental exams for  proper oral hygiene  Community Resource Referral / Chronic Care Management: CRR required this visit?  No   CCM required this visit?  No  Plan:   I have personally reviewed and noted the following in the patient's chart:   Medical and social history Use of alcohol, tobacco or illicit drugs  Current medications and supplements including opioid prescriptions. Patient is not currently taking opioid prescriptions. Functional ability and status Nutritional status Physical activity Advanced directives List of other physicians Hospitalizations, surgeries, and ER visits in previous 12 months Vitals Screenings to include cognitive, depression, and falls Referrals and appointments  In addition, I have reviewed and discussed with patient certain preventive protocols, quality metrics, and best practice recommendations. A written personalized care plan for preventive services as well as general preventive health recommendations were provided to  patient.   Deontrey Massi, CMA   07/03/2024   After Visit Summary: (Mail) Due to this being a telephonic visit, the after visit summary with patients personalized plan was offered to patient via mail   Notes: Nothing significant to report at this time.

## 2024-10-11 ENCOUNTER — Other Ambulatory Visit: Payer: Self-pay | Admitting: *Deleted

## 2024-10-11 DIAGNOSIS — Z87891 Personal history of nicotine dependence: Secondary | ICD-10-CM

## 2024-10-11 DIAGNOSIS — Z122 Encounter for screening for malignant neoplasm of respiratory organs: Secondary | ICD-10-CM

## 2024-10-18 ENCOUNTER — Other Ambulatory Visit: Payer: Self-pay | Admitting: Family Medicine

## 2024-10-31 ENCOUNTER — Encounter: Payer: Self-pay | Admitting: Acute Care

## 2025-07-08 ENCOUNTER — Ambulatory Visit
